# Patient Record
Sex: Male | Born: 1945 | Race: Black or African American | Hispanic: No | Marital: Married | Smoking: Never smoker
Health system: Southern US, Community
[De-identification: ages and names within clinical notes are randomized; demographics above are authoritative.]

## PROBLEM LIST (undated history)

## (undated) DIAGNOSIS — K219 Gastro-esophageal reflux disease without esophagitis: Secondary | ICD-10-CM

## (undated) DIAGNOSIS — E782 Mixed hyperlipidemia: Secondary | ICD-10-CM

## (undated) DIAGNOSIS — Z8709 Personal history of other diseases of the respiratory system: Secondary | ICD-10-CM

## (undated) DIAGNOSIS — E119 Type 2 diabetes mellitus without complications: Secondary | ICD-10-CM

## (undated) DIAGNOSIS — K5790 Diverticulosis of intestine, part unspecified, without perforation or abscess without bleeding: Secondary | ICD-10-CM

## (undated) DIAGNOSIS — I10 Essential (primary) hypertension: Secondary | ICD-10-CM

## (undated) DIAGNOSIS — N400 Enlarged prostate without lower urinary tract symptoms: Secondary | ICD-10-CM

## (undated) DIAGNOSIS — I2510 Atherosclerotic heart disease of native coronary artery without angina pectoris: Secondary | ICD-10-CM

## (undated) HISTORY — DX: Benign prostatic hyperplasia without lower urinary tract symptoms: N40.0

## (undated) HISTORY — DX: Atherosclerotic heart disease of native coronary artery without angina pectoris: I25.10

## (undated) HISTORY — DX: Gastro-esophageal reflux disease without esophagitis: K21.9

## (undated) HISTORY — DX: Mixed hyperlipidemia: E78.2

## (undated) HISTORY — DX: Type 2 diabetes mellitus without complications: E11.9

## (undated) HISTORY — PX: COLONOSCOPY: SHX174

## (undated) HISTORY — DX: Personal history of other diseases of the respiratory system: Z87.09

## (undated) HISTORY — DX: Essential (primary) hypertension: I10

## (undated) HISTORY — PX: HIP ARTHROSCOPY: SUR88

## (undated) HISTORY — PX: FRACTURE SURGERY: SHX138

## (undated) HISTORY — DX: Diverticulosis of intestine, part unspecified, without perforation or abscess without bleeding: K57.90

---
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
## 2019-07-26 ENCOUNTER — Emergency Department (HOSPITAL_COMMUNITY)
Admission: EM | Admit: 2019-07-26 | Discharge: 2019-07-26 | Disposition: A | Discharge status: Home / Self Care | Payer: Medicare HMO | Attending: Emergency Medicine | Admitting: Emergency Medicine

## 2019-07-26 ENCOUNTER — Emergency Department (HOSPITAL_COMMUNITY): Payer: Medicare HMO

## 2019-07-26 ENCOUNTER — Encounter (HOSPITAL_COMMUNITY): Payer: Self-pay | Admitting: Emergency Medicine

## 2019-07-26 ENCOUNTER — Other Ambulatory Visit: Payer: Self-pay

## 2019-07-26 DIAGNOSIS — I10 Essential (primary) hypertension: Secondary | ICD-10-CM | POA: Insufficient documentation

## 2019-07-26 DIAGNOSIS — R208 Other disturbances of skin sensation: Secondary | ICD-10-CM | POA: Diagnosis present

## 2019-07-26 DIAGNOSIS — E119 Type 2 diabetes mellitus without complications: Secondary | ICD-10-CM | POA: Diagnosis not present

## 2019-07-26 DIAGNOSIS — Z77098 Contact with and (suspected) exposure to other hazardous, chiefly nonmedicinal, chemicals: Secondary | ICD-10-CM | POA: Diagnosis not present

## 2019-07-26 MED ORDER — MAGIC MOUTHWASH W/LIDOCAINE: 5.0000 mL | Freq: Three times a day (TID) | ORAL | 0 refills | Status: DC | PRN

## 2019-07-26 NOTE — ED Triage Notes (Signed)
Approximately 2 weeks ago patient exposed to pesticide Triazicide, and currently having burning sensation in throat going into chest.  Seen by Primary and given Amoxicillan and Prednisone without any relief.

## 2019-07-26 NOTE — Discharge Instructions (Addendum)
Continue taking your prednisone as directed.  Follow-up with your primary doctor for recheck if needed.

## 2019-07-26 NOTE — ED Provider Notes (Signed)
[facility] EMERGENCY DEPARTMENT Provider Note   CSN: [id number] Arrival date & time: 07/26/19  1330     History Chief Complaint  Patient presents with  . Chemical Exposure    Pesticide    Zachary Craig is a 74 y.o. male.  HPI      Zachary Craig is a 74 y.o. male who presents to the Emergency Department complaining of burning sensation and tightness of his chest and throat.  Symptoms began nearly 1 week ago after accidentally inhaling a granular insecticide, Triazicide.  Patient was seen by PCP after exposure and prescribed amoxicillin which she is currently taking.  He denies any improvement of his symptoms, he was rechecked by PCP yesterday and started on prednisone.  He comes to the emergency department today for further evaluation of his burning pain.  He states that he feels he has difficulty swallowing and that his throat is closing up.  He is tolerating foods and liquids without difficulty.  He denies chest pain, vomiting, shortness of breath, fever, chills, swelling of his face lip or tongue.   Past Medical History:  Diagnosis Date  . Diabetes mellitus without complication (HCC)   . Hypertension     There are no problems to display for this patient.   Past Surgical History:  Procedure Laterality Date  . HIP ARTHROSCOPY Left        History reviewed. No pertinent family history.  Social History   Tobacco Use  . Smoking status: Never Smoker  . Smokeless tobacco: Never Used  Substance Use Topics  . Alcohol use: Never  . Drug use: Not on file    Home Medications Prior to Admission medications   Not on File    Allergies    Patient has no known allergies.  Review of Systems   Review of Systems  Constitutional: Negative for chills, fatigue and fever.  HENT: Positive for sore throat and trouble swallowing. Negative for congestion and voice change.   Respiratory: Positive for chest tightness. Negative for cough, shortness of breath and wheezing.     Cardiovascular: Negative for chest pain and palpitations.  Gastrointestinal: Negative for abdominal pain, blood in stool, nausea and vomiting.  Genitourinary: Negative for dysuria, flank pain and hematuria.  Musculoskeletal: Negative for arthralgias, back pain, myalgias, neck pain and neck stiffness.  Skin: Negative for rash.  Neurological: Negative for dizziness, weakness, numbness and headaches.  Hematological: Does not bruise/bleed easily.    Physical Exam Updated Vital Signs BP (!) 120/93 (BP Location: Right Arm)   Pulse 86   Temp 98.4 F (36.9 C) (Oral)   Resp 20   Ht 5\' 6"  (1.676 m)   Wt 88.9 kg   SpO2 100%   BMI 31.64 kg/m   Physical Exam Vitals and nursing note reviewed.  Constitutional:      Appearance: Normal appearance. He is not ill-appearing.  HENT:     Head: Atraumatic.     Mouth/Throat:     Mouth: Mucous membranes are moist.     Pharynx: Oropharynx is clear.     Comments: Mild edema of the uvula, uvula is midline.  No edema or erythema of the oropharynx.  No oral lesions or exudates noted. Eyes:     Conjunctiva/sclera: Conjunctivae normal.     Pupils: Pupils are equal, round, and reactive to light.  Cardiovascular:     Rate and Rhythm: Normal rate and regular rhythm.     Pulses: Normal pulses.  Musculoskeletal:  General: Normal range of motion.     Cervical back: Normal range of motion. No rigidity or tenderness.  Skin:    General: Skin is warm.     Capillary Refill: Capillary refill takes less than 2 seconds.     Findings: No erythema or rash.  Neurological:     General: No focal deficit present.     Mental Status: He is alert.     Sensory: No sensory deficit.     Motor: No weakness.     ED Results / Procedures / Treatments   Labs (all labs ordered are listed, but only abnormal results are displayed) Labs Reviewed - No data to display  EKG EKG Interpretation  Date/Time:  Tuesday July 26 2019 18:25:25 EDT Ventricular Rate:  65 PR  Interval:  172 QRS Duration: 80 QT Interval:  400 QTC Calculation: 416 R Axis:   0 Text Interpretation: Normal sinus rhythm Normal ECG No previous tracing Confirmed by Gwyneth Sprout ([id number]) on 07/27/2019 8:50:08 PM   Radiology DG Chest 2 View  Result Date: 07/26/2019 CLINICAL DATA:  Chemical inhalation 2 weeks ago EXAM: CHEST - 2 VIEW COMPARISON:  None. FINDINGS: The heart size and mediastinal contours are within normal limits. Both lungs are clear. The visualized skeletal structures are unremarkable. IMPRESSION: No active cardiopulmonary disease. Electronically Signed   By: Sharlet Salina M.D.   On: 07/26/2019 18:32    Procedures Procedures (including critical care time)  Medications Ordered in ED Medications - No data to display  ED Course  I have reviewed the triage vital signs and the nursing notes.  Pertinent labs & imaging results that were available during my care of the patient were reviewed by me and considered in my medical decision making (see chart for details).    MDM Rules/Calculators/A&P                          Patient here for evaluation of a inhaled chemical exposure that occurred 1 week ago.  Uvula is mildly edematous on exam but midline.  Airway is patent.  No oral lesions or other noted edema.  Patient speaking in complete sentences without respiratory distress.  Lungs are clear on exam EKG and chest x-ray are reassuring.  Patient currently taking prednisone which I feel is appropriate.  Symptoms likely related to irritation from inhaled chemical.  We will have him continue his steroid dose as directed and prescription given for Magic mouthwash for symptomatic treatment.  Patient agrees to close follow-up with PCP.  He appears appropriate for discharge home.   Final Clinical Impression(s) / ED Diagnoses Final diagnoses:  Exposure to chemical inhalation    Rx / DC Orders ED Discharge Orders    None       Pauline Aus, PA-C 07/28/19 1359      Geoffery Lyons, MD 07/30/19 2039

## 2019-08-05 ENCOUNTER — Encounter (HOSPITAL_BASED_OUTPATIENT_CLINIC_OR_DEPARTMENT_OTHER): Payer: Self-pay | Admitting: Emergency Medicine

## 2019-08-05 ENCOUNTER — Emergency Department (HOSPITAL_BASED_OUTPATIENT_CLINIC_OR_DEPARTMENT_OTHER): Payer: Medicare HMO

## 2019-08-05 ENCOUNTER — Other Ambulatory Visit: Payer: Self-pay

## 2019-08-05 ENCOUNTER — Emergency Department (HOSPITAL_BASED_OUTPATIENT_CLINIC_OR_DEPARTMENT_OTHER)
Admission: EM | Admit: 2019-08-05 | Discharge: 2019-08-05 | Disposition: A | Discharge status: Home / Self Care | Payer: Medicare HMO | Attending: Emergency Medicine | Admitting: Emergency Medicine

## 2019-08-05 DIAGNOSIS — I10 Essential (primary) hypertension: Secondary | ICD-10-CM | POA: Insufficient documentation

## 2019-08-05 DIAGNOSIS — T6091XA Toxic effect of unspecified pesticide, accidental (unintentional), initial encounter: Secondary | ICD-10-CM | POA: Diagnosis not present

## 2019-08-05 DIAGNOSIS — Z77098 Contact with and (suspected) exposure to other hazardous, chiefly nonmedicinal, chemicals: Secondary | ICD-10-CM

## 2019-08-05 DIAGNOSIS — E119 Type 2 diabetes mellitus without complications: Secondary | ICD-10-CM | POA: Diagnosis not present

## 2019-08-05 LAB — BASIC METABOLIC PANEL
Anion gap: 12 (ref 5–15)
BUN: 20 mg/dL (ref 8–23)
CO2: 24 mmol/L (ref 22–32)
Calcium: 8.7 mg/dL — ABNORMAL LOW (ref 8.9–10.3)
Chloride: 99 mmol/L (ref 98–111)
Creatinine, Ser: 1.26 mg/dL — ABNORMAL HIGH (ref 0.61–1.24)
GFR calc Af Amer: >60 mL/min (ref >60)
GFR calc non Af Amer: 57 mL/min — ABNORMAL LOW (ref >60)
Glucose, Bld: 137 mg/dL — ABNORMAL HIGH (ref 70–99)
Potassium: 5.1 mmol/L (ref 3.5–5.1)
Sodium: 135 mmol/L (ref 135–145)

## 2019-08-05 LAB — HEPATIC FUNCTION PANEL
ALT: 13 U/L (ref 0–44)
AST: 19 U/L (ref 15–41)
Albumin: 3.5 g/dL (ref 3.5–5.0)
Alkaline Phosphatase: 52 U/L (ref 38–126)
Bilirubin, Direct: 0.2 mg/dL (ref 0.0–0.2)
Indirect Bilirubin: 1.1 mg/dL — ABNORMAL HIGH (ref 0.3–0.9)
Total Bilirubin: 1.3 mg/dL — ABNORMAL HIGH (ref 0.3–1.2)
Total Protein: 7.2 g/dL (ref 6.5–8.1)

## 2019-08-05 LAB — CBC
HCT: 43.9 % (ref 39.0–52.0)
Hemoglobin: 14.5 g/dL (ref 13.0–17.0)
MCH: 31.5 pg (ref 26.0–34.0)
MCHC: 33 g/dL (ref 30.0–36.0)
MCV: 95.2 fL (ref 80.0–100.0)
Platelets: 311 10*3/uL (ref 150–400)
RBC: 4.61 MIL/uL (ref 4.22–5.81)
RDW: 12.7 % (ref 11.5–15.5)
WBC: 10.8 10*3/uL — ABNORMAL HIGH (ref 4.0–10.5)
nRBC: 0 % (ref 0.0–0.2)

## 2019-08-05 LAB — LIPASE, BLOOD: Lipase: 18 U/L (ref 11–51)

## 2019-08-05 LAB — TROPONIN I (HIGH SENSITIVITY): Troponin I (High Sensitivity): 3 ng/L (ref <18)

## 2019-08-05 MED ORDER — SUCRALFATE 1 GM/10ML PO SUSP: 1.0000 g | Freq: Once | ORAL | Status: AC

## 2019-08-05 MED ORDER — SUCRALFATE 1 GM/10ML PO SUSP
ORAL | Status: AC
  Administered 2019-08-05: 1 g via ORAL
  Filled 2019-08-05: qty 10

## 2019-08-05 MED ORDER — SODIUM CHLORIDE 0.9 % IV SOLN: INTRAVENOUS | Status: DC | PRN

## 2019-08-05 MED ORDER — FAMOTIDINE IN NACL 20-0.9 MG/50ML-% IV SOLN
20.0000 mg | Freq: Once | INTRAVENOUS | Status: AC
  Administered 2019-08-05: 20 mg via INTRAVENOUS
  Filled 2019-08-05: qty 50

## 2019-08-05 MED ORDER — PANTOPRAZOLE SODIUM 40 MG IV SOLR
40.0000 mg | Freq: Once | INTRAVENOUS | Status: AC
  Administered 2019-08-05: 40 mg via INTRAVENOUS
  Filled 2019-08-05: qty 40

## 2019-08-05 MED ORDER — SUCRALFATE 1 G PO TABS
1.0000 g | ORAL_TABLET | Freq: Once | ORAL | Status: DC
  Filled 2019-08-05: qty 1

## 2019-08-05 MED ORDER — FAMOTIDINE 20 MG PO TABS: 20.0000 mg | ORAL_TABLET | Freq: Two times a day (BID) | ORAL | 0 refills | Status: DC

## 2019-08-05 NOTE — Discharge Instructions (Addendum)
At this time there does not appear to be the presence of an emergent medical condition, however there is always the potential for conditions to change. Please read and follow the below instructions.  Please return to the Emergency Department immediately for any new or worsening symptoms. Please be sure to follow up with your Primary Care Provider within one week regarding your visit today; please call their office to schedule an appointment even if you are feeling better for a follow-up visit. Take the medication Pepcid as prescribed to help with your symptoms. Continue taking your Omeprazole as prescribed to help with your symptoms. You have been given referral to Dr. Jearld Fenton, Ear Nose and Throat Specialist for further evaluation of your symptoms. Call his office today to schedule a follow-up visit.  Get help right away if: You have pain in your arms, neck, jaw, teeth, or back. You feel sweaty, dizzy, or light-headed. You have chest pain or shortness of breath. You throw up (vomit) and your throw-up looks like blood or coffee grounds. You pass out (faint). Your poop (stool) is bloody or black. You cannot swallow, drink, or eat. You have any new/concerning or worsening of symptoms  Please read the additional information packets attached to your discharge summary.  Do not take your medicine if  develop an itchy rash, swelling in your mouth or lips, or difficulty breathing; call 911 and seek immediate emergency medical attention if this occurs.  You may review your lab tests and imaging results in their entirety on your MyChart account.  Please discuss all results of fully with your primary care provider and other specialist at your follow-up visit.  Note: Portions of this text may have been transcribed using voice recognition software. Every effort was made to ensure accuracy; however, inadvertent computerized transcription errors may still be present.

## 2019-08-05 NOTE — ED Triage Notes (Signed)
Pt exposed to pesticide Triazicide on 7/8 and has been having ongoing burning sensation to throat and chest since then. He has been seen several times with no improvement.

## 2019-08-05 NOTE — ED Provider Notes (Signed)
[facility] EMERGENCY DEPARTMENT Provider Note   CSN: [id number] Arrival date & time: 08/05/19  0944     History Chief Complaint  Patient presents with  . Chemical Exposure    Zachary Craig is a 74 y.o. male history of diabetes and hypertension.  Patient presents today for continued complications following Triazicide pesticide exposure on July 21, 2019.  He reports that he had breathed in some of this pesticide and has had a burning swelling sensation to his throat and chest since that time.  He reports that he has been intermittently on steroid medications and Benadryl with mild transient improvement of his symptoms, he complains symptoms are currently moderate intensity constant with no clear aggravating factors.  He reports he is currently taking a 5 mg daily prednisone prescribed by his primary care doctor.  He has also been on amoxicillin flu symptoms shortly after exposure without improvement.  Additionally patient reports he has developed multiple episodes of nonbloody/nonbilious emesis over the last 2 days and has had trouble keeping food down.  Denies any reexposure, denies fever/chills, vision changes, drooling, voice change, hemoptysis, pleurisy, abdominal pain, dysuria/hematuria, diarrhea, extremity swelling/color change or any additional concerns. HPI     Past Medical History:  Diagnosis Date  . Diabetes mellitus without complication (HCC)   . Hypertension     There are no problems to display for this patient.   Past Surgical History:  Procedure Laterality Date  . HIP ARTHROSCOPY Left        No family history on file.  Social History   Tobacco Use  . Smoking status: Never Smoker  . Smokeless tobacco: Never Used  Substance Use Topics  . Alcohol use: Never  . Drug use: Not on file    Home Medications Prior to Admission medications   Medication Sig Start Date End Date Taking? Authorizing Provider  famotidine (PEPCID) 20 MG tablet Take 1  tablet (20 mg total) by mouth 2 (two) times daily. 08/05/19   Harlene Salts A, PA-C  magic mouthwash w/lidocaine SOLN Take 5 mLs by mouth 3 (three) times daily as needed for mouth pain. Swish and spit, do not swallow 07/26/19   Triplett, Tammy, PA-C    Allergies    Patient has no known allergies.  Review of Systems   Review of Systems Ten systems are reviewed and are negative for acute change except as noted in the HPI  Physical Exam Updated Vital Signs BP 120/80   Pulse 71   Temp 99.1 F (37.3 C) (Oral)   Resp 20   Ht 5\' 6"  (1.676 m)   Wt 82 kg   SpO2 100%   BMI 29.17 kg/m   Physical Exam Constitutional:      General: He is not in acute distress.    Appearance: Normal appearance. He is well-developed. He is not ill-appearing or diaphoretic.  HENT:     Head: Normocephalic and atraumatic.     Jaw: There is normal jaw occlusion.     Mouth/Throat:     Comments: Erythema/cobblestoning of the posterior oropharynx, uvula and arches.  The patient has normal phonation and is in control of secretions. No stridor.  Midline uvula without edema. Soft palate rises symmetrically. No tonsillar swelling or exudates. Tongue protrusion is normal, floor of mouth is soft. No trismus. No creptius on neck palpation. No gingival erythema or fluctuance noted. Mucus membranes moist. Eyes:     General: Vision grossly intact. Gaze aligned appropriately.     Extraocular Movements: Extraocular  movements intact.     Pupils: Pupils are equal, round, and reactive to light.  Neck:     Trachea: Trachea and phonation normal. No tracheal tenderness or tracheal deviation.  Cardiovascular:     Rate and Rhythm: Normal rate and regular rhythm.  Pulmonary:     Effort: Pulmonary effort is normal. No accessory muscle usage or respiratory distress.     Breath sounds: Normal breath sounds and air entry.  Chest:     Chest wall: No tenderness.  Abdominal:     General: There is no distension.     Palpations:  Abdomen is soft.     Tenderness: There is no abdominal tenderness. There is no guarding or rebound.  Musculoskeletal:        General: Normal range of motion.     Cervical back: Normal range of motion and neck supple. No edema or rigidity.     Right lower leg: No edema.     Left lower leg: No edema.  Skin:    General: Skin is warm and dry.  Neurological:     Mental Status: He is alert.     GCS: GCS eye subscore is 4. GCS verbal subscore is 5. GCS motor subscore is 6.     Comments: Speech is clear and goal oriented, follows commands Major Cranial nerves without deficit, no facial droop Moves extremities without ataxia, coordination intact  Psychiatric:        Behavior: Behavior normal.     ED Results / Procedures / Treatments   Labs (all labs ordered are listed, but only abnormal results are displayed) Labs Reviewed  BASIC METABOLIC PANEL - Abnormal; Notable for the following components:      Result Value   Glucose, Bld 137 (*)    Creatinine, Ser 1.26 (*)    Calcium 8.7 (*)    GFR calc non Af Amer 57 (*)    All other components within normal limits  CBC - Abnormal; Notable for the following components:   WBC 10.8 (*)    All other components within normal limits  HEPATIC FUNCTION PANEL - Abnormal; Notable for the following components:   Total Bilirubin 1.3 (*)    Indirect Bilirubin 1.1 (*)    All other components within normal limits  LIPASE, BLOOD  TROPONIN I (HIGH SENSITIVITY)    EKG EKG Interpretation  Date/Time:  Friday August 05 2019 10:56:11 EDT Ventricular Rate:  64 PR Interval:    QRS Duration: 99 QT Interval:  420 QTC Calculation: 434 R Axis:   -9 Text Interpretation: Sinus rhythm Abnormal R-wave progression, early transition no change from previous Confirmed by Arby Barrette [phone]) on 08/05/2019 1:09:02 PM   Radiology DG Chest 2 View  Result Date: 08/05/2019 CLINICAL DATA:  Burning sensation for 1.5 months since pesticide exposure. EXAM: CHEST - 2 VIEW  COMPARISON:  07/26/2019 FINDINGS: Telemetry leads overlie the chest. The cardiomediastinal silhouette is unchanged with normal heart size. The lungs are well inflated and clear. No pleural effusion or pneumothorax is identified. No acute osseous abnormality is seen. IMPRESSION: No active cardiopulmonary disease. Electronically Signed   By: Sebastian Ache M.D.   On: 08/05/2019 11:24    Procedures Procedures (including critical care time)  Medications Ordered in ED Medications  0.9 %  sodium chloride infusion ( Intravenous Stopped 08/05/19 1239)  famotidine (PEPCID) IVPB 20 mg premix (0 mg Intravenous Stopped 08/05/19 1239)  pantoprazole (PROTONIX) injection 40 mg (40 mg Intravenous Given 08/05/19 1349)  sucralfate (CARAFATE) 1  GM/10ML suspension 1 g (1 g Oral Given 08/05/19 1348)    ED Course  I have reviewed the triage vital signs and the nursing notes.  Pertinent labs & imaging results that were available during my care of the patient were reviewed by me and considered in my medical decision making (see chart for details).    MDM Rules/Calculators/A&P                         Additional history obtained from: 1. Nursing notes from this visit. 2. EMR, reviewed ED visit from July 26, 2019 where patient presented with the same complaint.  At that time patient had reassuring chest x-ray and EKG, was discharged with Magic mouthwash and encouraged to continue prednisone. 3. Family, patient's wife at bedside. 4. Reviewed Triazicide safety data sheet, no specific recommendations for inhalation/ingestions. Shows acute inhalation LC50>2mg /L (EPA tox. Category IV) No label elements required --------------------------------- I ordered, reviewed and interpreted labs which include: High-sensitivity troponin within normal limits, symptom onset greater than 2 weeks ago no indication for delta troponin. Lipase within normal limits, doubt pancreatitis. BMP shows mild elevation of creatinine at 1.26, normal  BUN, no priors to compare, no emergent electrolyte derangement or gap. LFTs show no emergent elevations. CBC shows nonspecific instances of 10.8, normal hemoglobin.  CXR:  IMPRESSION:  No active cardiopulmonary disease.  I personally reviewed patient's chest x-ray and agree with radiologist interpretation.  EKG: Sinus rhythm Abnormal R-wave progression, early transition no change from previous Confirmed by Arby Barrette [phone]) on 08/05/2019 1:09:02 PM - Given information obtained on the chemical safety data sheet lower suspicion that this is the cause of his symptoms today.  Symptoms are suspicious for esophagitis.  Patient treated with Protonix, Pepcid and Carafate.  Low suspicion for ACS, PE, dissection or other emergent cardiopulmonary etiologies at this time.  Tolerating p.o. and abdomen is soft and nontender doubt perforation, SBO or other emergent pathologies at this time.  Patient seen and evaluated by Dr. Donnald Garre, plan of care at this time is to start patient on Pepcid, have him continue his omeprazole at home, give GERD handout regarding food choices and discharge.  Additionally will give patient referral to ENT for follow-up for burning sensation.  On reassessment patient is resting comfortably no acute distress states understanding of care plan and is agreeable to discharge.  At this time there does not appear to be any evidence of an acute emergency medical condition and the patient appears stable for discharge with appropriate outpatient follow up. Diagnosis was discussed with patient who verbalizes understanding of care plan and is agreeable to discharge. I have discussed return precautions with patient and wife who verbalizes understanding. Patient encouraged to follow-up with their PCP and ENT. All questions answered.   Note: Portions of this report may have been transcribed using voice recognition software. Every effort was made to ensure accuracy; however, inadvertent  computerized transcription errors may still be present. Final Clinical Impression(s) / ED Diagnoses Final diagnoses:  Chemical exposure    Rx / DC Orders ED Discharge Orders         Ordered    famotidine (PEPCID) 20 MG tablet  2 times daily     Discontinue  Reprint     08/05/19 1508           Elizabeth Palau 08/05/19 1518    Arby Barrette, MD 08/10/19 1354

## 2019-08-05 NOTE — ED Provider Notes (Signed)
Medical screening examination/treatment/procedure(s) were conducted as a shared visit with non-physician practitioner(s) and myself.  I personally evaluated the patient during the encounter.  EKG Interpretation  Date/Time:  Friday August 05 2019 10:56:11 EDT Ventricular Rate:  64 PR Interval:    QRS Duration: 99 QT Interval:  420 QTC Calculation: 434 R Axis:   -9 Text Interpretation: Sinus rhythm Abnormal R-wave progression, early transition no change from previous Confirmed by Arby Barrette [phone]) on 08/05/2019 1:09:02 PM  Patient has had persistent burning sensation in his throat since inhaling up pesticide several weeks earlier.  He has seen his PCP and had multiple therapies.  He has been tried on a steroid medication and Benadryl with mild improvement at the time but then recrudescence of quality of burning in his throat.  No difficulty breathing or swallowing.  Just over the past several days patient has had several episodes of vomiting.  Nonbloody.  The patient is clinically well in appearance.  He is alert and nontoxic.  Airway widely patent.  No erythema or intraoral oral swelling.  Neck is supple.  No stridor.  No difficulty handling secretions.  Heart regular, lungs clear.  Abdomen is soft without tenderness or guarding.   Diagnostic studies unremarkable at this time.  I have low suspicion that this is a residual reaction or injury from the inhalation of pesticides several weeks earlier.  Physical exam is normal.  No upper airway compromise or wheezing on exam.  Patient has a persistent intermittent burning quality in his throat.  At this time recommend initiating PPI therapy and GERD precautions.  Patient recommended for close follow-up with PCP for ongoing monitoring of his symptoms and response to treatment.   Arby Barrette, MD 08/10/19 1353

## 2019-08-08 ENCOUNTER — Encounter (INDEPENDENT_AMBULATORY_CARE_PROVIDER_SITE_OTHER): Payer: Self-pay | Admitting: Gastroenterology

## 2019-08-08 ENCOUNTER — Other Ambulatory Visit: Payer: Self-pay

## 2019-08-08 ENCOUNTER — Ambulatory Visit (INDEPENDENT_AMBULATORY_CARE_PROVIDER_SITE_OTHER): Payer: Medicare HMO | Admitting: Gastroenterology

## 2019-08-08 ENCOUNTER — Encounter (INDEPENDENT_AMBULATORY_CARE_PROVIDER_SITE_OTHER): Payer: Self-pay | Admitting: *Deleted

## 2019-08-08 ENCOUNTER — Other Ambulatory Visit (INDEPENDENT_AMBULATORY_CARE_PROVIDER_SITE_OTHER): Payer: Self-pay | Admitting: *Deleted

## 2019-08-08 VITALS — BP 115/76 | HR 76 | Temp 98.6°F | Wt 179.0 lb

## 2019-08-08 DIAGNOSIS — K219 Gastro-esophageal reflux disease without esophagitis: Secondary | ICD-10-CM

## 2019-08-08 DIAGNOSIS — R1310 Dysphagia, unspecified: Secondary | ICD-10-CM | POA: Diagnosis not present

## 2019-08-08 DIAGNOSIS — R112 Nausea with vomiting, unspecified: Secondary | ICD-10-CM

## 2019-08-08 DIAGNOSIS — R1319 Other dysphagia: Secondary | ICD-10-CM

## 2019-08-08 MED ORDER — ONDANSETRON HCL 4 MG/5ML PO SOLN: 4.0000 mg | Freq: Three times a day (TID) | ORAL | 2 refills | Status: DC | PRN

## 2019-08-08 NOTE — Patient Instructions (Addendum)
Schedule EGD Schedule barium esophagram with pill Start zofran 4 mg q8h as needed for vomiting

## 2019-08-08 NOTE — Progress Notes (Signed)
Zachary Craig, M.D. Gastroenterology & Hepatology [name] [address] [place], [place] [address] Primary Care Physician: Zachary Specking, MD [address] [place] [address]  Referring MD: self  I will communicate my assessment and recommendations to the referring MD via EMR. Note: Occasional unusual wording and randomly placed punctuation marks may result from the use of speech recognition technology to transcribe this document"  Chief Complaint: Dysphagia  History of Present Illness: Zachary Craig is a 74 y.o. male with PMH DM, GERD and HTN, who presents for evaluation of dysphagia after recent exposure to Triazicide.  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  After this 12 hours later he presented persistent discomfort and soreness in his throat after which he presented episodes of hiccups.  A day later he reports having a sensation of tightness in his throat that was not allowing him to swallow adequately.  Since then he has presented progressive dysphagia to solids that has affected his food intake severely.  The patient states that he is only able to swallow applesauce consistency food at this moment, he is not able to swallow pills adequately.  The patient was seen by his PCP who prescribed him prednisone 10 mg every day, he has been seen twice in the ER for similar symptoms.  He has been tapered down on his prednisone dose by 5 mg but he took the medication until last Saturday without any improvement in his symptomatology.  The patient states that the only medication that helped him was Magic mouthwash that decreased his throat discomfort mildly.  He denies having any odynophagia.  He has lost close to 18 pounds since his symptoms started.  He has also reported having decreased appetite and anosmia.  He is getting more  concerned as he has been throwing up 2 times per day recently.  He is having several bouts of dry heaving since last Tuesday.  His last full meal was 2 weeks ago.  Last time he had some solid was last Friday (noodle soup) but he threw it up.  The patient endorses having a history of GERD for many years which she has been managing with omeprazole as needed.  He was put on omeprazole daily recently as he was also taking steroids..  He was also given a prescription for Pepcid but he vomited it. The patient denies having any nausea, vomiting, fever, chills, hematochezia, melena, hematemesis, abdominal distention, abdominal pain, diarrhea, jaundice, pruritus or weight loss.  Last EGD: never Last Colonoscopy: less than 10 years ago per patient but no polyps.  FHx: neg for any gastrointestinal/liver disease, cousin colon cancer dx'd in her 50s Social: neg smoking, alcohol or illicit drug use Surgical: non contributory  Past Medical History: Past Medical History:  Diagnosis Date  . Diabetes mellitus without complication (HCC)   . Hypertension     Past Surgical History: Past Surgical History:  Procedure Laterality Date  . HIP ARTHROSCOPY Left     Family History:History reviewed. No pertinent family history.  Social History: Social History   Tobacco Use  Smoking Status Never Smoker  Smokeless Tobacco Never Used   Social History   Substance and Sexual Activity  Alcohol Use Never   Social History   Substance and Sexual Activity  Drug Use Not on file    Allergies: Allergies  Allergen Reactions  . Sulfa  Antibiotics     Medications: Current Outpatient Medications  Medication Sig Dispense Refill  . famotidine (PEPCID) 20 MG tablet Take 1 tablet (20 mg total) by mouth 2 (two) times daily. 30 tablet 0  . losartan (COZAAR) 25 MG tablet Take 25 mg by mouth daily.    . metFORMIN (GLUCOPHAGE-XR) 500 MG 24 hr tablet Take 1,000 mg by mouth daily.    . Multiple Vitamins-Minerals  (MULTIMINERAL PLUS PO) Take by mouth. Once a day    . multivitamin-iron-minerals-folic acid (CENTRUM) chewable tablet Chew 1 tablet by mouth daily. Once a day    . omeprazole (PRILOSEC) 40 MG capsule SMARTSIG:1 Capsule(s) By Mouth PRN    . Probiotic Product (DIGESTIVE ADV+BOWEL SUPPORT PO) Take by mouth. 2 tab once a day    . rosuvastatin (CRESTOR) 5 MG tablet Take 5 mg by mouth once a week.    . tamsulosin (FLOMAX) 0.4 MG CAPS capsule Take 0.4 mg by mouth daily.    . magic mouthwash w/lidocaine SOLN Take 5 mLs by mouth 3 (three) times daily as needed for mouth pain. Swish and spit, do not swallow (Patient not taking: Reported on 08/08/2019) 100 mL 0  . ondansetron (ZOFRAN) 4 MG/5ML solution Take 5 mLs (4 mg total) by mouth every 8 (eight) hours as needed for nausea or vomiting. 50 mL 2   No current facility-administered medications for this visit.    Review of Systems: GENERAL: negative for malaise, night sweats HEENT: No changes in hearing or vision, no nose bleeds or other nasal problems. NECK: Negative for lumps, goiter, pain and significant neck swelling RESPIRATORY: Negative for cough, wheezing CARDIOVASCULAR: Negative for chest pain, leg swelling, palpitations, orthopnea GI: SEE HPI MUSCULOSKELETAL: Negative for joint pain or swelling, back pain, and muscle pain. SKIN: Negative for lesions, rash PSYCH: Negative for sleep disturbance, mood disorder and recent psychosocial stressors. HEMATOLOGY Negative for prolonged bleeding, bruising easily, and swollen nodes. ENDOCRINE: Negative for cold or heat intolerance, polyuria, polydipsia and goiter. NEURO: negative for tremor, gait imbalance, syncope and seizures. The remainder of the review of systems is noncontributory.   Physical Exam: BP 115/76   Pulse 76   Temp 98.6 F (37 C)   Wt 179 lb (81.2 kg)   BMI 28.89 kg/m  GENERAL: The patient is AO x3, in no acute distress. HEENT: Head is normocephalic and atraumatic. EOMI are  intact. Mouth is well hydrated and without lesions. NECK: Supple. No masses or lymphadenopathy LUNGS: Clear to auscultation. No presence of rhonchi/wheezing/rales. Adequate chest expansion HEART: RRR, normal s1 and s2. ABDOMEN: Soft, nontender, no guarding, no peritoneal signs, and nondistended. BS +. No masses. EXTREMITIES: Without any cyanosis, clubbing, rash, lesions or edema. NEUROLOGIC: AOx3, no focal motor deficit. SKIN: no jaundice, no rashes   Imaging/Labs: as above  I personally reviewed and interpreted the available labs, imaging and endoscopic files.  Impression and Plan: May Ozment is a 74 y.o. male with PMH DM, GERD and HTN, who presents for evaluation of dysphagia after recent exposure to Triazicide.  The patient has presented with recent onset of dysphagia mostly to solids with significant weight loss and changes in his appetite and smell.  I checked the components of Triazicide, its main active component is Gamma-Cyhalothrin.  The substance is not associated directly to any caustic effects or to esophagitis, but it is reportedly early can cause peripheral neuropathy.  It seems less likely that this is the insulting agent leading to the patient's alterations as he clearly states that  he inhaled the substance and did not swallow it.  Given the fact he has lost a large amount of weight and he has been poorly tolerating food intake, we will need to investigate his dysphagia with an EGD and a barium esophagram.  The patient will be given a prescription for liquid Zofran so he can decrease his nausea and vomiting episodes.  The patient was advised that if he presents persistent symptoms despite taking the medication or if he is unable to eat/drink anything, he will need to go to the ER for admission with IV fluid hydration and evaluation by our service inpatient.  The patient understood and agreed.  More than 50% of the office visit was dedicated to discussing the procedure, including  the day of and risks involved. Patient understands what the procedure involves including the benefits and any risks. Patient understands alternatives to the proposed procedure. Risks including (but not limited to) bleeding, tearing of the lining (perforation), rupture of adjacent organs, problems with heart and lung function, infection, and medication reactions. A small percentage of complications may require surgery, hospitalization, repeat endoscopic procedure, and/or transfusion.  - Schedule EGD - Schedule barium esophagram with pill - Start liquid zofran 4 mg q8h as needed for vomiting  All questions were answered.      Dolores Frame, MD Gastroenterology and Hepatology [facility]

## 2019-08-09 ENCOUNTER — Other Ambulatory Visit: Payer: Self-pay

## 2019-08-09 ENCOUNTER — Other Ambulatory Visit (HOSPITAL_COMMUNITY)
Admission: RE | Admit: 2019-08-09 | Discharge: 2019-08-09 | Disposition: A | Discharge status: Home / Self Care | Payer: Medicare HMO | Source: Ambulatory Visit | Attending: Gastroenterology | Admitting: Gastroenterology

## 2019-08-09 ENCOUNTER — Encounter (HOSPITAL_COMMUNITY): Payer: Self-pay

## 2019-08-09 ENCOUNTER — Encounter (HOSPITAL_COMMUNITY)
Admission: RE | Admit: 2019-08-09 | Discharge: 2019-08-09 | Disposition: A | Discharge status: Home / Self Care | Payer: Medicare HMO | Source: Ambulatory Visit | Attending: Gastroenterology | Admitting: Gastroenterology

## 2019-08-09 DIAGNOSIS — Z01812 Encounter for preprocedural laboratory examination: Secondary | ICD-10-CM | POA: Insufficient documentation

## 2019-08-09 DIAGNOSIS — Z20822 Contact with and (suspected) exposure to covid-19: Secondary | ICD-10-CM | POA: Insufficient documentation

## 2019-08-09 LAB — SARS CORONAVIRUS 2 (TAT 6-24 HRS): SARS Coronavirus 2: NEGATIVE

## 2019-08-09 NOTE — Patient Instructions (Signed)
Zachary Craig  08/09/2019     @PREFPERIOPPHARMACY @   Your procedure is scheduled on 08/12/2019.  Report to 08/14/2019 at  1315 (1:15)  P.M.  Call this number if you have problems the morning of surgery:  [phone]   Remember:  Follow the diet and prep instructions given to you by Dr [phone] office.                       Take these medicines the morning of surgery with A SIP OF WATER  Pepcid, omeprazole, zofran(if needed), flomax.    Do not wear jewelry, make-up or nail polish.  Do not wear lotions, powders, or perfume. Please wear deodorant and brush your teeth.  Do not shave 48 hours prior to surgery.  Men may shave face and neck.  Do not bring valuables to the hospital.  [facility] is not responsible for any belongings or valuables.  Contacts, dentures or bridgework may not be worn into surgery.  Leave your suitcase in the car.  After surgery it may be brought to your room.  For patients admitted to the hospital, discharge time will be determined by your treatment team.  Patients discharged the day of surgery will not be allowed to drive home.   Name and phone number of your driver:   family Special instructions: DO NOT smoke the morning of your procedure.  Please read over the following fact sheets that you were given. Anesthesia Post-op Instructions and Care and Recovery After Surgery       Upper Endoscopy, Adult, Care After This sheet gives you information about how to care for yourself after your procedure. Your health care provider may also give you more specific instructions. If you have problems or questions, contact your health care provider. What can I expect after the procedure? After the procedure, it is common to have:  A sore throat.  Mild stomach pain or discomfort.  Bloating.  Nausea. Follow these instructions at home:   Follow instructions from your health care provider about what to eat or drink after your procedure.   Return to your normal activities as told by your health care provider. Ask your health care provider what activities are safe for you.  Take over-the-counter and prescription medicines only as told by your health care provider.  Do not drive for 24 hours if you were given a sedative during your procedure.  Keep all follow-up visits as told by your health care provider. This is important. Contact a health care provider if you have:  A sore throat that lasts longer than one day.  Trouble swallowing. Get help right away if:  You vomit blood or your vomit looks like coffee grounds.  You have: ? A fever. ? Bloody, black, or tarry stools. ? A severe sore throat or you cannot swallow. ? Difficulty breathing. ? Severe pain in your chest or abdomen. Summary  After the procedure, it is common to have a sore throat, mild stomach discomfort, bloating, and nausea.  Do not drive for 24 hours if you were given a sedative during the procedure.  Follow instructions from your health care provider about what to eat or drink after your procedure.  Return to your normal activities as told by your health care provider. This information is not intended to replace advice given to you by your health care provider. Make sure you discuss any questions you have with your health care  provider. Document Revised: 06/23/2017 Document Reviewed: 06/01/2017 Elsevier Patient Education  2020 Elsevier Inc.  Esophageal Dilatation Esophageal dilatation, also called esophageal dilation, is a procedure to widen or open (dilate) a blocked or narrowed part of the esophagus. The esophagus is the part of the body that moves food and liquid from the mouth to the stomach. You may need this procedure if:  You have a buildup of scar tissue in your esophagus that makes it difficult, painful, or impossible to swallow. This can be caused by gastroesophageal reflux disease (GERD).  You have cancer of the esophagus.  There is a  problem with how food moves through your esophagus. In some cases, you may need this procedure repeated at a later time to dilate the esophagus gradually. Tell a health care provider about:  Any allergies you have.  All medicines you are taking, including vitamins, herbs, eye drops, creams, and over-the-counter medicines.  Any problems you or family members have had with anesthetic medicines.  Any blood disorders you have.  Any surgeries you have had.  Any medical conditions you have.  Any antibiotic medicines you are required to take before dental procedures.  Whether you are pregnant or may be pregnant. What are the risks? Generally, this is a safe procedure. However, problems may occur, including:  Bleeding due to a tear in the lining of the esophagus.  A hole (perforation) in the esophagus. What happens before the procedure?  Follow instructions from your health care provider about eating or drinking restrictions.  Ask your health care provider about changing or stopping your regular medicines. This is especially important if you are taking diabetes medicines or blood thinners.  Plan to have someone take you home from the hospital or clinic.  Plan to have a responsible adult care for you for at least 24 hours after you leave the hospital or clinic. This is important. What happens during the procedure?  You may be given a medicine to help you relax (sedative).  A numbing medicine may be sprayed into the back of your throat, or you may gargle the medicine.  Your health care provider may perform the dilatation using various surgical instruments, such as: ? Simple dilators. This instrument is carefully placed in the esophagus to stretch it. ? Guided wire bougies. This involves using an endoscope to insert a wire into the esophagus. A dilator is passed over this wire to enlarge the esophagus. Then the wire is removed. ? Balloon dilators. An endoscope with a small balloon at  the end is inserted into the esophagus. The balloon is inflated to stretch the esophagus and open it up. The procedure may vary among health care providers and hospitals. What happens after the procedure?  Your blood pressure, heart rate, breathing rate, and blood oxygen level will be monitored until the medicines you were given have worn off.  Your throat may feel slightly sore and numb. This will improve slowly over time.  You will not be allowed to eat or drink until your throat is no longer numb.  When you are able to drink, urinate, and sit on the edge of the bed without nausea or dizziness, you may be able to return home. Follow these instructions at home:  Take over-the-counter and prescription medicines only as told by your health care provider.  Do not drive for 24 hours if you were given a sedative during your procedure.  You should have a responsible adult with you for 24 hours after the procedure.  Follow instructions from your health care provider about any eating or drinking restrictions.  Do not use any products that contain nicotine or tobacco, such as cigarettes and e-cigarettes. If you need help quitting, ask your health care provider.  Keep all follow-up visits as told by your health care provider. This is important. Get help right away if you:  Have a fever.  Have chest pain.  Have pain that is not relieved by medication.  Have trouble breathing.  Have trouble swallowing.  Vomit blood. Summary  Esophageal dilatation, also called esophageal dilation, is a procedure to widen or open (dilate) a blocked or narrowed part of the esophagus.  Plan to have someone take you home from the hospital or clinic.  For this procedure, a numbing medicine may be sprayed into the back of your throat, or you may gargle the medicine.  Do not drive for 24 hours if you were given a sedative during your procedure. This information is not intended to replace advice given to  you by your health care provider. Make sure you discuss any questions you have with your health care provider. Document Revised: 10/27/2018 Document Reviewed: 11/04/2016 Elsevier Patient Education  2020 [place] After These instructions provide you with information about caring for yourself after your procedure. Your health care provider may also give you more specific instructions. Your treatment has been planned according to current medical practices, but problems sometimes occur. Call your health care provider if you have any problems or questions after your procedure. What can I expect after the procedure? After your procedure, you may:  Feel sleepy for several hours.  Feel clumsy and have poor balance for several hours.  Feel forgetful about what happened after the procedure.  Have poor judgment for several hours.  Feel nauseous or vomit.  Have a sore throat if you had a breathing tube during the procedure. Follow these instructions at home: For at least 24 hours after the procedure:      Have a responsible adult stay with you. It is important to have someone help care for you until you are awake and alert.  Rest as needed.  Do not: ? Participate in activities in which you could fall or become injured. ? Drive. ? Use heavy machinery. ? Drink alcohol. ? Take sleeping pills or medicines that cause drowsiness. ? Make important decisions or sign legal documents. ? Take care of children on your own. Eating and drinking  Follow the diet that is recommended by your health care provider.  If you vomit, drink water, juice, or soup when you can drink without vomiting.  Make sure you have little or no nausea before eating solid foods. General instructions  Take over-the-counter and prescription medicines only as told by your health care provider.  If you have sleep apnea, surgery and certain medicines can increase your risk for breathing  problems. Follow instructions from your health care provider about wearing your sleep device: ? Anytime you are sleeping, including during daytime naps. ? While taking prescription pain medicines, sleeping medicines, or medicines that make you drowsy.  If you smoke, do not smoke without supervision.  Keep all follow-up visits as told by your health care provider. This is important. Contact a health care provider if:  You keep feeling nauseous or you keep vomiting.  You feel light-headed.  You develop a rash.  You have a fever. Get help right away if:  You have trouble breathing. Summary  For several  hours after your procedure, you may feel sleepy and have poor judgment.  Have a responsible adult stay with you for at least 24 hours or until you are awake and alert. This information is not intended to replace advice given to you by your health care provider. Make sure you discuss any questions you have with your health care provider. Document Revised: 03/30/2017 Document Reviewed: 04/22/2015 Elsevier Patient Education  [place].

## 2019-08-12 ENCOUNTER — Ambulatory Visit (HOSPITAL_COMMUNITY)
Admission: RE | Admit: 2019-08-12 | Discharge: 2019-08-12 | Disposition: A | Discharge status: Home / Self Care | Payer: Medicare HMO | Attending: Gastroenterology | Admitting: Gastroenterology

## 2019-08-12 ENCOUNTER — Ambulatory Visit (HOSPITAL_COMMUNITY): Payer: Medicare HMO | Admitting: Anesthesiology

## 2019-08-12 ENCOUNTER — Encounter (HOSPITAL_COMMUNITY)
Admission: RE | Disposition: A | Discharge status: Home / Self Care | Payer: Self-pay | Source: Home / Self Care | Attending: Gastroenterology

## 2019-08-12 ENCOUNTER — Encounter (HOSPITAL_COMMUNITY): Payer: Self-pay | Admitting: Gastroenterology

## 2019-08-12 DIAGNOSIS — K219 Gastro-esophageal reflux disease without esophagitis: Secondary | ICD-10-CM

## 2019-08-12 DIAGNOSIS — K317 Polyp of stomach and duodenum: Secondary | ICD-10-CM | POA: Diagnosis not present

## 2019-08-12 DIAGNOSIS — R112 Nausea with vomiting, unspecified: Secondary | ICD-10-CM | POA: Diagnosis not present

## 2019-08-12 DIAGNOSIS — E119 Type 2 diabetes mellitus without complications: Secondary | ICD-10-CM | POA: Diagnosis not present

## 2019-08-12 DIAGNOSIS — Z7984 Long term (current) use of oral hypoglycemic drugs: Secondary | ICD-10-CM | POA: Diagnosis not present

## 2019-08-12 DIAGNOSIS — I10 Essential (primary) hypertension: Secondary | ICD-10-CM | POA: Insufficient documentation

## 2019-08-12 DIAGNOSIS — Z79899 Other long term (current) drug therapy: Secondary | ICD-10-CM | POA: Insufficient documentation

## 2019-08-12 DIAGNOSIS — K2960 Other gastritis without bleeding: Secondary | ICD-10-CM | POA: Diagnosis not present

## 2019-08-12 DIAGNOSIS — K3189 Other diseases of stomach and duodenum: Secondary | ICD-10-CM

## 2019-08-12 DIAGNOSIS — R1310 Dysphagia, unspecified: Secondary | ICD-10-CM | POA: Insufficient documentation

## 2019-08-12 DIAGNOSIS — R1314 Dysphagia, pharyngoesophageal phase: Secondary | ICD-10-CM | POA: Diagnosis not present

## 2019-08-12 DIAGNOSIS — K2950 Unspecified chronic gastritis without bleeding: Secondary | ICD-10-CM | POA: Insufficient documentation

## 2019-08-12 HISTORY — PX: BIOPSY: SHX5522

## 2019-08-12 HISTORY — PX: ESOPHAGEAL DILATION: SHX303

## 2019-08-12 HISTORY — PX: ESOPHAGOGASTRODUODENOSCOPY (EGD) WITH PROPOFOL: SHX5813

## 2019-08-12 LAB — GLUCOSE, CAPILLARY
Glucose-Capillary: 114 mg/dL — ABNORMAL HIGH (ref 70–99)
Glucose-Capillary: 115 mg/dL — ABNORMAL HIGH (ref 70–99)

## 2019-08-12 SURGERY — ESOPHAGOGASTRODUODENOSCOPY (EGD) WITH PROPOFOL
Anesthesia: General

## 2019-08-12 MED ORDER — LIDOCAINE VISCOUS HCL 2 % MT SOLN
OROMUCOSAL | Status: AC
  Filled 2019-08-12: qty 15

## 2019-08-12 MED ORDER — GLYCOPYRROLATE 0.2 MG/ML IJ SOLN
0.2000 mg | Freq: Once | INTRAMUSCULAR | Status: AC
  Administered 2019-08-12: 0.2 mg via INTRAVENOUS
  Filled 2019-08-12: qty 1

## 2019-08-12 MED ORDER — LIDOCAINE VISCOUS HCL 2 % MT SOLN
15.0000 mL | Freq: Once | OROMUCOSAL | Status: AC
  Administered 2019-08-12: 15 mL via OROMUCOSAL

## 2019-08-12 MED ORDER — PROPOFOL 500 MG/50ML IV EMUL
INTRAVENOUS | Status: DC | PRN
  Administered 2019-08-12: 150 ug/kg/min via INTRAVENOUS

## 2019-08-12 MED ORDER — LACTATED RINGERS IV SOLN: Freq: Once | INTRAVENOUS | Status: AC

## 2019-08-12 MED ORDER — PROPOFOL 10 MG/ML IV BOLUS
INTRAVENOUS | Status: DC | PRN
  Administered 2019-08-12: 50 mg via INTRAVENOUS

## 2019-08-12 MED ORDER — LACTATED RINGERS IV SOLN
INTRAVENOUS | Status: DC | PRN
Start: 2019-08-12 — End: 2019-08-12

## 2019-08-12 NOTE — Op Note (Addendum)
[facility] Patient Name: Zachary Craig Procedure Date: 08/12/2019 11:44 AM MRN: [id number] Date of Birth: [birth year] Attending MD: [name] ,  CSN: [id number] Age: 74 Admit Type: Outpatient Procedure:                Upper GI endoscopy Indications:              Dysphagia, vomiting Providers:                [name], Criselda Peaches. Patsy Lager, RN, Edythe Clarity, Technician Referring MD:              Medicines:                Monitored Anesthesia Care Complications:            No immediate complications. Estimated Blood Loss:     Estimated blood loss: none. Procedure:                Pre-Anesthesia Assessment:                           - Prior to the procedure, a History and Physical                            was performed, and patient medications, allergies                            and sensitivities were reviewed. The patient's                            tolerance of previous anesthesia was reviewed.                           - The risks and benefits of the procedure and the                            sedation options and risks were discussed with the                            patient. All questions were answered and informed                            consent was obtained.                           - ASA Grade Assessment: II - A patient with mild                            systemic disease.                           After obtaining informed consent, the endoscope was                            passed under direct vision. Throughout the  procedure, the patient's blood pressure, pulse, and                            oxygen saturations were monitored continuously. The                            [id number]-H190 ([id number]) scope was introduced through the                            mouth, and advanced to the second part of duodenum.                            The upper GI endoscopy was accomplished without                             difficulty. The patient tolerated the procedure                            well. Scope In: 11:49:29 AM Scope Out: 12:02:54 PM Total Procedure Duration: 0 hours 13 minutes 25 seconds  Findings:      The Z-line was regular and was found 37 cm from the incisors.      The examined esophagus was normal. A guidewire was placed and the scope       was withdrawn. Dilation was performed with a Savary dilator with no       resistance at 18 mm. No hematin was seen upon reinspection.      A single localized 3 mm erosion with no bleeding and no stigmata of       recent bleeding was found in the gastric body. Biopsies from body and       antrum were taken with a cold forceps for Helicobacter pylori testing.      Three small sessile polyps were found in the gastric fundus, consistent       with gastric fundic polyps.      The examined duodenum was normal. Impression:               - Z-line regular, 37 cm from the incisors.                           - Normal esophagus. Dilated.                           - Erosive gastropathy with no bleeding and no                            stigmata of recent bleeding. Biopsied.                           - Three gastric polyps.                           - Normal examined duodenum. Moderate Sedation:      Per Anesthesia Care Recommendation:           - Discharge patient to home (ambulatory).                           -  Advance diet as tolerated.                           - Await pathology results.                           - Schedule modified barium swallow. Procedure Code(s):        --- Professional ---                           [phone], Esophagogastroduodenoscopy, flexible,                            transoral; with insertion of guide wire followed by                            passage of dilator(s) through esophagus over guide                            wire Diagnosis Code(s):        --- Professional ---                           K31.89, Other diseases of  stomach and duodenum                           R13.10, Dysphagia, unspecified CPT copyright 2019 American Medical Association. All rights reserved. The codes documented in this report are preliminary and upon coder review may  be revised to meet current compliance requirements. [name], MD [name],  08/12/2019 12:12:27 PM This report has been signed electronically. Number of Addenda: 0

## 2019-08-12 NOTE — Anesthesia Postprocedure Evaluation (Signed)
Anesthesia Post Note  Patient: Zachary Craig  Procedure(s) Performed: ESOPHAGOGASTRODUODENOSCOPY (EGD) WITH PROPOFOL (N/A ) ESOPHAGEAL DILATION (N/A ) BIOPSY  Patient location during evaluation: PACU Anesthesia Type: General Level of consciousness: awake, oriented, awake and alert and patient cooperative Pain management: satisfactory to patient Vital Signs Assessment: post-procedure vital signs reviewed and stable Respiratory status: spontaneous breathing, respiratory function stable and nonlabored ventilation Cardiovascular status: stable Postop Assessment: no apparent nausea or vomiting Anesthetic complications: no   No complications documented.   Last Vitals:  Vitals:   08/12/19 1055  BP: 125/82  Pulse: 82  Resp: 18  Temp: 36.6 C  SpO2: 100%    Last Pain:  Vitals:   08/12/19 1148  TempSrc:   PainSc: 0-No pain                 GREGORY,SUZANNE

## 2019-08-12 NOTE — Anesthesia Preprocedure Evaluation (Signed)
Anesthesia Evaluation  Patient identified by MRN, date of birth, ID band Patient awake    Reviewed: Allergy & Precautions, NPO status , Patient's Chart, lab work & pertinent test results  History of Anesthesia Complications Negative for: history of anesthetic complications  Airway Mallampati: II  TM Distance: >3 FB Neck ROM: Full    Dental  (+) Dental Advisory Given   Pulmonary neg pulmonary ROS,    Pulmonary exam normal breath sounds clear to auscultation       Cardiovascular hypertension, Pt. on medications Normal cardiovascular exam Rhythm:Regular Rate:Normal  05-Aug-2019 10:56:11 [facility] ROUTINE RECORD Sinus rhythm Abnormal R-wave progression, early transition   Neuro/Psych negative neurological ROS  negative psych ROS   GI/Hepatic Neg liver ROS, GERD  Medicated and Poorly Controlled,Nausea/vomiting    Endo/Other  diabetes, Well Controlled, Type 2, Oral Hypoglycemic Agents  Renal/GU      Musculoskeletal negative musculoskeletal ROS (+)   Abdominal   Peds  Hematology negative hematology ROS (+)   Anesthesia Other Findings   Reproductive/Obstetrics negative OB ROS                             Anesthesia Physical Anesthesia Plan  ASA: II  Anesthesia Plan: General   Post-op Pain Management:    Induction: Intravenous  PONV Risk Score and Plan: 2  Airway Management Planned: Nasal Cannula, Natural Airway and Simple Face Mask  Additional Equipment:   Intra-op Plan:   Post-operative Plan:   Informed Consent: I have reviewed the patients History and Physical, chart, labs and discussed the procedure including the risks, benefits and alternatives for the proposed anesthesia with the patient or authorized representative who has indicated his/her understanding and acceptance.     Dental advisory given  Plan Discussed with: CRNA and Surgeon  Anesthesia  Plan Comments:         Anesthesia Quick Evaluation

## 2019-08-12 NOTE — Discharge Instructions (Signed)
You are being discharged to home.  Advance your diet as tolerated.  We are waiting for your pathology results. Schedule modified barium swallow.    (Left message for [facility] Office coordinator to contact patient regarding scheduling Barium Swallow.)      Upper Endoscopy, Adult, Care After This sheet gives you information about how to care for yourself after your procedure. Your health care provider may also give you more specific instructions. If you have problems or questions, contact your health care provider. What can I expect after the procedure? After the procedure, it is common to have:  A sore throat.  Mild stomach pain or discomfort.  Bloating.  Nausea. Follow these instructions at home:   Follow instructions from your health care provider about what to eat or drink after your procedure.  Return to your normal activities as told by your health care provider. Ask your health care provider what activities are safe for you.  Take over-the-counter and prescription medicines only as told by your health care provider.  Do not drive for 24 hours if you were given a sedative during your procedure.  Keep all follow-up visits as told by your health care provider. This is important. Contact a health care provider if you have:  A sore throat that lasts longer than one day.  Trouble swallowing. Get help right away if:  You vomit blood or your vomit looks like coffee grounds.  You have: ? A fever. ? Bloody, black, or tarry stools. ? A severe sore throat or you cannot swallow. ? Difficulty breathing. ? Severe pain in your chest or abdomen. Summary  After the procedure, it is common to have a sore throat, mild stomach discomfort, bloating, and nausea.  Do not drive for 24 hours if you were given a sedative during the procedure.  Follow instructions from your health care provider about what to eat or drink after your procedure.  Return to your normal activities as  told by your health care provider. This information is not intended to replace advice given to you by your health care provider. Make sure you discuss any questions you have with your health care provider. Document Revised: 06/23/2017 Document Reviewed: 06/01/2017 Elsevier Patient Education  2020 Elsevier [employer]     Monitored Anesthesia Care, Care After These instructions provide you with information about caring for yourself after your procedure. Your health care provider may also give you more specific instructions. Your treatment has been planned according to current medical practices, but problems sometimes occur. Call your health care provider if you have any problems or questions after your procedure. What can I expect after the procedure? After your procedure, you may:  Feel sleepy for several hours.  Feel clumsy and have poor balance for several hours.  Feel forgetful about what happened after the procedure.  Have poor judgment for several hours.  Feel nauseous or vomit.  Have a sore throat if you had a breathing tube during the procedure. Follow these instructions at home: For at least 24 hours after the procedure:      Have a responsible adult stay with you. It is important to have someone help care for you until you are awake and alert.  Rest as needed.  Do not: ? Participate in activities in which you could fall or become injured. ? Drive. ? Use heavy machinery. ? Drink alcohol. ? Take sleeping pills or medicines that cause drowsiness. ? Make important decisions or sign legal documents. ? Take care of  children on your own. Eating and drinking  Follow the diet that is recommended by your health care provider.  If you vomit, drink water, juice, or soup when you can drink without vomiting.  Make sure you have little or no nausea before eating solid foods. General instructions  Take over-the-counter and prescription medicines only as told by your health care  provider.  If you have sleep apnea, surgery and certain medicines can increase your risk for breathing problems. Follow instructions from your health care provider about wearing your sleep device: ? Anytime you are sleeping, including during daytime naps. ? While taking prescription pain medicines, sleeping medicines, or medicines that make you drowsy.  If you smoke, do not smoke without supervision.  Keep all follow-up visits as told by your health care provider. This is important. Contact a health care provider if:  You keep feeling nauseous or you keep vomiting.  You feel light-headed.  You develop a rash.  You have a fever. Get help right away if:  You have trouble breathing. Summary  For several hours after your procedure, you may feel sleepy and have poor judgment.  Have a responsible adult stay with you for at least 24 hours or until you are awake and alert. This information is not intended to replace advice given to you by your health care provider. Make sure you discuss any questions you have with your health care provider. Document Revised: 03/30/2017 Document Reviewed: 04/22/2015 Elsevier Patient Education  [place].

## 2019-08-12 NOTE — H&P (Signed)
Zachary Craig is an 74 y.o. male.   Chief Complaint: Dysphagia and upper abdominal discomfort after exposure to Triazicide HPI: 74 y.o. male with PMH DM, GERD and HTN, who presents for evaluation of dysphagia after recent exposure to Triazicide. the patient reported new onset of acute dysphagia and persistent nausea and vomiting after being exposed to the substance which was dispersed in there.  He has not been able to eat adequately and has lost close to 18 pounds since then.  Patient has not improved while taking PPI and states that the use of Magic mouthwash has helped him.  Past Medical History:  Diagnosis Date  . Diabetes mellitus without complication (HCC)   . Hypertension     Past Surgical History:  Procedure Laterality Date  . HIP ARTHROSCOPY Left     History reviewed. No pertinent family history. Social History:  reports that he has never smoked. He has never used smokeless tobacco. He reports that he does not drink alcohol and does not use drugs.  Allergies:  Allergies  Allergen Reactions  . Sulfa Antibiotics Itching    Medications Prior to Admission  Medication Sig Dispense Refill  . CINNAMON PO Take 1 capsule by mouth daily.    . famotidine (PEPCID) 20 MG tablet Take 1 tablet (20 mg total) by mouth 2 (two) times daily. 30 tablet 0  . losartan (COZAAR) 25 MG tablet Take 25 mg by mouth daily.    . metFORMIN (GLUCOPHAGE-XR) 500 MG 24 hr tablet Take 500 mg by mouth daily before breakfast.     . Multiple Vitamin (MULTIVITAMIN WITH MINERALS) TABS tablet Take 1 tablet by mouth daily.    Marland Kitchen omeprazole (PRILOSEC) 40 MG capsule Take 40 mg by mouth daily before breakfast.     . ondansetron (ZOFRAN) 4 MG/5ML solution Take 5 mLs (4 mg total) by mouth every 8 (eight) hours as needed for nausea or vomiting. 50 mL 2  . Probiotic Product (DIGESTIVE ADV+BOWEL SUPPORT PO) Take 2 capsules by mouth daily.     . rosuvastatin (CRESTOR) 5 MG tablet Take 5 mg by mouth every Sunday.     .  tamsulosin (FLOMAX) 0.4 MG CAPS capsule Take 0.4 mg by mouth daily.    . magic mouthwash w/lidocaine SOLN Take 5 mLs by mouth 3 (three) times daily as needed for mouth pain. Swish and spit, do not swallow (Patient not taking: Reported on 08/08/2019) 100 mL 0    No results found for this or any previous visit (from the past 48 hour(s)). No results found.  Review of Systems  Constitutional: Positive for appetite change.  HENT: Positive for sore throat and trouble swallowing.   Eyes: Negative.   Respiratory: Negative.   Cardiovascular: Negative.   Gastrointestinal: Positive for abdominal distention.  Endocrine: Negative.   Genitourinary: Negative.   Musculoskeletal: Negative.   Allergic/Immunologic: Negative.   Neurological: Negative.   Hematological: Negative.   Psychiatric/Behavioral: Negative.     Blood pressure 125/82, pulse 82, resp. rate 18, height 5\' 6"  (1.676 m), weight 81.2 kg, SpO2 100 %. Physical Exam  GENERAL: The patient is AO x3, in no acute distress. HEENT: Head is normocephalic and atraumatic. EOMI are intact. Mouth is well hydrated and without lesions. NECK: Supple. No masses or lymphadenopathy LUNGS: Clear to auscultation. No presence of rhonchi/wheezing/rales. Adequate chest expansion HEART: RRR, normal s1 and s2. ABDOMEN: Soft, nontender, no guarding, no peritoneal signs, and nondistended. BS +. No masses. EXTREMITIES: Without any cyanosis, clubbing, rash, lesions or edema. NEUROLOGIC:  AOx3, no focal motor deficit. SKIN: no jaundice, no rashes  Assessment/Plan 74 y.o. male with PMH DM, GERD and HTN, who presents for evaluation of dysphagia after recent exposure to Triazicide.  We will proceed with EGD for evaluation of new onset dysphagia and persistent nausea with vomiting, along with weight loss.  Dolores Frame, MD 08/12/2019, 11:34 AM

## 2019-08-12 NOTE — Transfer of Care (Signed)
Immediate Anesthesia Transfer of Care Note  Patient: Zachary Craig  Procedure(s) Performed: ESOPHAGOGASTRODUODENOSCOPY (EGD) WITH PROPOFOL (N/A ) ESOPHAGEAL DILATION (N/A ) BIOPSY  Patient Location: PACU  Anesthesia Type:General  Level of Consciousness: awake, alert , oriented and patient cooperative  Airway & Oxygen Therapy: Patient Spontanous Breathing  Post-op Assessment: Report given to RN, Post -op Vital signs reviewed and stable and Patient moving all extremities X 4  Post vital signs: Reviewed and stable  Last Vitals:  Vitals Value Taken Time  BP    Temp    Pulse    Resp    SpO2      Last Pain:  Vitals:   08/12/19 1148  TempSrc:   PainSc: 0-No pain      Patients Stated Pain Goal: 6 (08/12/19 1055)  Complications: No complications documented.

## 2019-08-14 ENCOUNTER — Other Ambulatory Visit: Payer: Self-pay

## 2019-08-14 ENCOUNTER — Encounter (HOSPITAL_COMMUNITY): Payer: Self-pay | Admitting: Emergency Medicine

## 2019-08-14 ENCOUNTER — Emergency Department (HOSPITAL_COMMUNITY)
Admission: EM | Admit: 2019-08-14 | Discharge: 2019-08-14 | Disposition: A | Discharge status: Home / Self Care | Payer: Medicare HMO | Attending: Emergency Medicine | Admitting: Emergency Medicine

## 2019-08-14 ENCOUNTER — Emergency Department (HOSPITAL_COMMUNITY): Payer: Medicare HMO

## 2019-08-14 DIAGNOSIS — E119 Type 2 diabetes mellitus without complications: Secondary | ICD-10-CM | POA: Diagnosis not present

## 2019-08-14 DIAGNOSIS — Z882 Allergy status to sulfonamides status: Secondary | ICD-10-CM | POA: Insufficient documentation

## 2019-08-14 DIAGNOSIS — Z7984 Long term (current) use of oral hypoglycemic drugs: Secondary | ICD-10-CM | POA: Diagnosis not present

## 2019-08-14 DIAGNOSIS — K219 Gastro-esophageal reflux disease without esophagitis: Secondary | ICD-10-CM | POA: Insufficient documentation

## 2019-08-14 DIAGNOSIS — I10 Essential (primary) hypertension: Secondary | ICD-10-CM | POA: Insufficient documentation

## 2019-08-14 DIAGNOSIS — R112 Nausea with vomiting, unspecified: Secondary | ICD-10-CM

## 2019-08-14 DIAGNOSIS — Z79899 Other long term (current) drug therapy: Secondary | ICD-10-CM | POA: Insufficient documentation

## 2019-08-14 DIAGNOSIS — I700 Atherosclerosis of aorta: Secondary | ICD-10-CM | POA: Insufficient documentation

## 2019-08-14 DIAGNOSIS — Z6828 Body mass index (BMI) 28.0-28.9, adult: Secondary | ICD-10-CM | POA: Diagnosis not present

## 2019-08-14 DIAGNOSIS — I313 Pericardial effusion (noninflammatory): Secondary | ICD-10-CM | POA: Diagnosis not present

## 2019-08-14 DIAGNOSIS — I2510 Atherosclerotic heart disease of native coronary artery without angina pectoris: Secondary | ICD-10-CM | POA: Diagnosis not present

## 2019-08-14 DIAGNOSIS — K5730 Diverticulosis of large intestine without perforation or abscess without bleeding: Secondary | ICD-10-CM | POA: Insufficient documentation

## 2019-08-14 DIAGNOSIS — R634 Abnormal weight loss: Secondary | ICD-10-CM | POA: Diagnosis not present

## 2019-08-14 LAB — TYPE AND SCREEN
ABO/RH(D): A POS
Antibody Screen: NEGATIVE

## 2019-08-14 LAB — COMPREHENSIVE METABOLIC PANEL
ALT: 13 U/L (ref 0–44)
AST: 14 U/L — ABNORMAL LOW (ref 15–41)
Albumin: 3.6 g/dL (ref 3.5–5.0)
Alkaline Phosphatase: 52 U/L (ref 38–126)
Anion gap: 9 (ref 5–15)
BUN: 9 mg/dL (ref 8–23)
CO2: 25 mmol/L (ref 22–32)
Calcium: 8.3 mg/dL — ABNORMAL LOW (ref 8.9–10.3)
Chloride: 104 mmol/L (ref 98–111)
Creatinine, Ser: 1.15 mg/dL (ref 0.61–1.24)
GFR calc Af Amer: >60 mL/min (ref >60)
GFR calc non Af Amer: >60 mL/min (ref >60)
Glucose, Bld: 112 mg/dL — ABNORMAL HIGH (ref 70–99)
Potassium: 4 mmol/L (ref 3.5–5.1)
Sodium: 138 mmol/L (ref 135–145)
Total Bilirubin: 0.6 mg/dL (ref 0.3–1.2)
Total Protein: 6.9 g/dL (ref 6.5–8.1)

## 2019-08-14 LAB — CBC
HCT: 39.5 % (ref 39.0–52.0)
Hemoglobin: 13 g/dL (ref 13.0–17.0)
MCH: 32.1 pg (ref 26.0–34.0)
MCHC: 32.9 g/dL (ref 30.0–36.0)
MCV: 97.5 fL (ref 80.0–100.0)
Platelets: 178 10*3/uL (ref 150–400)
RBC: 4.05 MIL/uL — ABNORMAL LOW (ref 4.22–5.81)
RDW: 12.8 % (ref 11.5–15.5)
WBC: 7 10*3/uL (ref 4.0–10.5)
nRBC: 0 % (ref 0.0–0.2)

## 2019-08-14 LAB — ABO/RH: ABO/RH(D): A POS

## 2019-08-14 LAB — LIPASE, BLOOD: Lipase: 20 U/L (ref 11–51)

## 2019-08-14 MED ORDER — LIDOCAINE VISCOUS HCL 2 % MT SOLN
15.0000 mL | Freq: Once | OROMUCOSAL | Status: AC
  Administered 2019-08-14: 15 mL via ORAL
  Filled 2019-08-14: qty 15

## 2019-08-14 MED ORDER — IOHEXOL 300 MG/ML  SOLN
100.0000 mL | Freq: Once | INTRAMUSCULAR | Status: AC | PRN
  Administered 2019-08-14: 100 mL via INTRAVENOUS

## 2019-08-14 MED ORDER — ONDANSETRON HCL 4 MG/2ML IJ SOLN
4.0000 mg | Freq: Once | INTRAMUSCULAR | Status: AC
  Administered 2019-08-14: 4 mg via INTRAVENOUS
  Filled 2019-08-14: qty 2

## 2019-08-14 MED ORDER — SODIUM CHLORIDE 0.9 % IV BOLUS
1000.0000 mL | Freq: Once | INTRAVENOUS | Status: AC
  Administered 2019-08-14: 1000 mL via INTRAVENOUS

## 2019-08-14 MED ORDER — ALUM & MAG HYDROXIDE-SIMETH 200-200-20 MG/5ML PO SUSP
30.0000 mL | Freq: Once | ORAL | Status: AC
  Administered 2019-08-14: 30 mL via ORAL
  Filled 2019-08-14: qty 30

## 2019-08-14 MED ORDER — SUCRALFATE 1 GM/10ML PO SUSP
1.0000 g | Freq: Three times a day (TID) | ORAL | 0 refills | Status: DC
Start: 2019-08-14 — End: 2019-09-08

## 2019-08-14 NOTE — ED Provider Notes (Signed)
[facility] EMERGENCY DEPARTMENT Provider Note   CSN: [id number] Arrival date & time: 08/14/19  1704     History Chief Complaint  Patient presents with  . Emesis    Zachary Craig is a 74 y.o. male.  HPI 74 year old male presents with vomiting.  He had an endoscopy 2 days ago as he has been having vomiting for a couple weeks with weight loss.  His emesis typically has been brown and yellow.  Did okay yesterday and was eating.  However today after eating he developed one episode of vomiting.  It was light brown.  There was report from triage that his emesis is coffee grounds but he states it has been light brown with no blood or black emesis.  He has been having no bowel movements for several days up until yesterday when he had 1 bowel movement after some medicine. He's been having a lot burning sensation going up his chest.   Past Medical History:  Diagnosis Date  . Diabetes mellitus without complication (HCC)   . Hypertension     Patient Active Problem List   Diagnosis Date Noted  . Dysphagia 08/08/2019  . GERD (gastroesophageal reflux disease) 08/08/2019    Past Surgical History:  Procedure Laterality Date  . HIP ARTHROSCOPY Left        History reviewed. No pertinent family history.  Social History   Tobacco Use  . Smoking status: Never Smoker  . Smokeless tobacco: Never Used  Vaping Use  . Vaping Use: Never used  Substance Use Topics  . Alcohol use: Never  . Drug use: Never    Home Medications Prior to Admission medications   Medication Sig Start Date End Date Taking? Authorizing Provider  CINNAMON PO Take 1 capsule by mouth daily.   Yes [provider]  famotidine (PEPCID) 20 MG tablet Take 1 tablet (20 mg total) by mouth 2 (two) times daily. 08/05/19  Yes Harlene Salts A, PA-C  losartan (COZAAR) 25 MG tablet Take 25 mg by mouth daily. 03/30/19  Yes [provider]  metFORMIN (GLUCOPHAGE-XR) 500 MG 24 hr tablet Take 500 mg by mouth daily  before breakfast.  04/23/19  Yes [provider]  omeprazole (PRILOSEC) 40 MG capsule Take 40 mg by mouth daily before breakfast.  02/24/19  Yes [provider]  ondansetron (ZOFRAN) 4 MG/5ML solution Take 5 mLs (4 mg total) by mouth every 8 (eight) hours as needed for nausea or vomiting. 08/08/19  Yes Marguerita Merles, Reuel Boom, MD  Probiotic Product (DIGESTIVE ADV+BOWEL SUPPORT PO) Take 2 capsules by mouth daily.    Yes [provider]  rosuvastatin (CRESTOR) 5 MG tablet Take 5 mg by mouth every Sunday.  05/20/19  Yes [provider]  tamsulosin (FLOMAX) 0.4 MG CAPS capsule Take 0.4 mg by mouth daily. 07/11/19  Yes [provider]  magic mouthwash w/lidocaine SOLN Take 5 mLs by mouth 3 (three) times daily as needed for mouth pain. Swish and spit, do not swallow Patient not taking: Reported on 08/08/2019 07/26/19   Triplett, Tammy, PA-C  sucralfate (CARAFATE) 1 GM/10ML suspension Take 10 mLs (1 g total) by mouth 4 (four) times daily -  with meals and at bedtime. 08/14/19   Pricilla Loveless, MD    Allergies    Sulfa antibiotics  Review of Systems   Review of Systems  Constitutional: Negative for fever.  Cardiovascular: Positive for chest pain.  Gastrointestinal: Positive for constipation, nausea and vomiting. Negative for abdominal pain.  All other systems  reviewed and are negative.   Physical Exam Updated Vital Signs BP (!) 140/79   Pulse 77   Temp 98 F (36.7 C) (Oral)   Resp 19   Ht 5\' 6"  (1.676 m)   Wt 81.2 kg   SpO2 100%   BMI 28.89 kg/m   Physical Exam Vitals and nursing note reviewed.  Constitutional:      General: He is not in acute distress.    Appearance: He is well-developed. He is not ill-appearing or diaphoretic.     Comments: Patient showed me a bottle containing his emesis, it is light brown without signs of bleeding  HENT:     Head: Normocephalic and atraumatic.     Right Ear: External ear normal.     Left Ear: External ear  normal.     Nose: Nose normal.  Eyes:     General:        Right eye: No discharge.        Left eye: No discharge.  Cardiovascular:     Rate and Rhythm: Normal rate and regular rhythm.     Heart sounds: Normal heart sounds.  Pulmonary:     Effort: Pulmonary effort is normal.     Breath sounds: Normal breath sounds.  Abdominal:     General: There is no distension.     Palpations: Abdomen is soft.     Tenderness: There is no abdominal tenderness.  Musculoskeletal:     Cervical back: Neck supple.  Skin:    General: Skin is warm and dry.  Neurological:     Mental Status: He is alert.  Psychiatric:        Mood and Affect: Mood is not anxious.     ED Results / Procedures / Treatments   Labs (all labs ordered are listed, but only abnormal results are displayed) Labs Reviewed  COMPREHENSIVE METABOLIC PANEL - Abnormal; Notable for the following components:      Result Value   Glucose, Bld 112 (*)    Calcium 8.3 (*)    AST 14 (*)    All other components within normal limits  CBC - Abnormal; Notable for the following components:   RBC 4.05 (*)    All other components within normal limits  LIPASE, BLOOD  TYPE AND SCREEN  ABO/RH    EKG EKG Interpretation  Date/Time:  Sunday August 14 2019 18:46:02 EDT Ventricular Rate:  69 PR Interval:    QRS Duration: 93 QT Interval:  408 QTC Calculation: 438 R Axis:   -10 Text Interpretation: Sinus rhythm Abnormal R-wave progression, early transition no acute ST/T changes similar to August 05 2019 Confirmed by 07-15-1975 [id number]) on 08/14/2019 8:17:48 PM   Radiology CT ABDOMEN PELVIS W CONTRAST  Result Date: 08/14/2019 CLINICAL DATA:  Nausea, vomiting EXAM: CT ABDOMEN AND PELVIS WITH CONTRAST TECHNIQUE: Multidetector CT imaging of the abdomen and pelvis was performed using the standard protocol following bolus administration of intravenous contrast. CONTRAST:  10/14/2019 OMNIPAQUE IOHEXOL 300 MG/ML  SOLN COMPARISON:  CT abdomen pelvis  04/16/2012 (report only, images unavailable) FINDINGS: Lower chest: Atelectatic changes in the otherwise clear lung bases. Normal cardiac size. Small pericardial effusion. Few coronary artery calcifications are noted. Hepatobiliary: No worrisome focal liver lesions. Smooth liver surface contour. Normal hepatic attenuation. Gallbladder and biliary tree are unremarkable. No visible calcified gallstones. Pancreas: Unremarkable. No pancreatic ductal dilatation or surrounding inflammatory changes. Spleen: Normal in size. No concerning splenic lesions. Adrenals/Urinary Tract: Normal adrenal glands. Kidneys enhance and excrete  symmetrically. 3 cm fluid attenuation cyst present in the lower pole left kidney without concerning CT features. Additional scattered subcentimeter hypertension foci in both kidneys too small to fully characterize on CT imaging but statistically likely benign. no concerning renal mass, urolithiasis or hydronephrosis. Physiologic bladder distension. No gross bladder abnormality though partially obscured by streak artifact from left hip hardware. Stomach/Bowel: Distal esophagus, stomach and duodenal sweep are unremarkable. No small bowel wall thickening or dilatation. No evidence of obstruction. A normal appendix is visualized. No colonic dilatation or wall thickening. Scattered distal colonic diverticula without focal inflammation to suggest diverticulitis. Vascular/Lymphatic: Atherosclerotic calcifications within the abdominal aorta and branch vessels. No aneurysm or ectasia. No enlarged abdominopelvic lymph nodes. Reproductive: No gross abnormality the prostate or seminal vesicles though partially obscured by streak artifact from marker prosthesis. Other: No abdominopelvic free fluid or free gas. No bowel containing hernias. Tiny fat containing umbilical hernia without inflammation. Musculoskeletal: No acute osseous abnormality or suspicious osseous lesion. Multilevel degenerative changes are  present in the imaged portions of the spine. Few benign-appearing lucent lesions in the left ilium with narrow zone of transition. Extensive sclerotic and cystic changes of the right femoral head with articular surface collapse compatible with sequela of prior avascular necrosis. Milder bony remodeling along the acetabular side of the joint. Postsurgical changes from prior total left hip arthroplasty. IMPRESSION: 1. No acute intra-abdominal process to provide cause for patient's symptoms. 2. Colonic diverticulosis without evidence of diverticulitis. 3. Small pericardial effusion. 4. Avascular necrosis of the right femoral head. 5. 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 6. Coronary artery calcifications are present. Please note that the presence of coronary artery calcium documents the presence of coronary artery disease, the severity of this disease and any potential stenosis cannot be assessed on this non-gated CT examination. 7. Aortic Atherosclerosis (ICD10-I70.0). Electronically Signed   By: Kreg ShropshirePrice  DeHay M.D.   On: 08/14/2019 22:06    Procedures Procedures (including critical care time)  Medications Ordered in ED Medications  alum & mag hydroxide-simeth (MAALOX/MYLANTA) 200-200-20 MG/5ML suspension 30 mL (60 mLs Oral Incomplete 08/14/19 2326)    And  lidocaine (XYLOCAINE) 2 % viscous mouth solution 15 mL (has no administration in time range)  sodium chloride 0.9 % bolus 1,000 mL (0 mLs Intravenous Stopped 08/14/19 2016)  ondansetron (ZOFRAN) injection 4 mg (4 mg Intravenous Given 08/14/19 1858)  iohexol (OMNIPAQUE) 300 MG/ML solution 100 mL (100 mLs Intravenous Contrast Given 08/14/19 2135)    ED Course  I have reviewed the triage vital signs and the nursing notes.  Pertinent labs & imaging results that were available during my care of the patient were reviewed by me and considered in my medical decision making (see chart for details).    MDM Rules/Calculators/A&P                           Patient has not had any vomiting in the multiple hours he has been in the emergency department.  This is a subacute problem.  CT obtained given fairly benign EGD couple days ago.  There are some coronary artery calcifications but this does not sound like an anginal problem.  I did offer to send a troponin though he has been here long enough that he now will just wants to leave.  His pain sounds like reflux.  He has already set up an outpatient follow-up with a cardiologist and would like to keep this.  Advised him  to follow-up with his gastroenterologist.  I will give him Carafate in addition to his Pepcid and omeprazole. He has zofran.  Originally the nursing notes indicate this might be a possible bleed but the emesis viewed by myself does not look bloody Final Clinical Impression(s) / ED Diagnoses Final diagnoses:  Non-intractable vomiting with nausea, unspecified vomiting type  Coronary artery disease involving native heart without angina pectoris, unspecified vessel or lesion type    Rx / DC Orders ED Discharge Orders         Ordered    sucralfate (CARAFATE) 1 GM/10ML suspension  3 times daily with meals & bedtime     Discontinue  Reprint     08/14/19 2323           Pricilla Loveless, MD 08/14/19 2335

## 2019-08-14 NOTE — Discharge Instructions (Signed)
If you develop worsening, continued, or recurrent abdominal pain, uncontrolled vomiting, fever, chest or back pain, or any other new/concerning symptoms then return to the ER for evaluation.   Your CT scan shows coronary artery calcifications.  It is important to follow-up with a cardiologist to have this further assessed.

## 2019-08-14 NOTE — ED Notes (Signed)
Patient transported to CT 

## 2019-08-14 NOTE — ED Triage Notes (Signed)
Pt had a upper endoscopy on Friday and is vomiting today. Vomit is characterized as looking like coffee grounds.

## 2019-08-15 ENCOUNTER — Other Ambulatory Visit: Payer: Self-pay

## 2019-08-15 ENCOUNTER — Other Ambulatory Visit (INDEPENDENT_AMBULATORY_CARE_PROVIDER_SITE_OTHER): Payer: Self-pay | Admitting: Gastroenterology

## 2019-08-15 DIAGNOSIS — A048 Other specified bacterial intestinal infections: Secondary | ICD-10-CM | POA: Insufficient documentation

## 2019-08-15 DIAGNOSIS — R1310 Dysphagia, unspecified: Secondary | ICD-10-CM

## 2019-08-15 LAB — SURGICAL PATHOLOGY

## 2019-08-15 MED ORDER — OMEPRAZOLE 40 MG PO CPDR: 40.0000 mg | DELAYED_RELEASE_CAPSULE | Freq: Two times a day (BID) | ORAL | 0 refills | Status: DC

## 2019-08-15 MED ORDER — CLARITHROMYCIN 500 MG PO TABS: 500.0000 mg | ORAL_TABLET | Freq: Two times a day (BID) | ORAL | 0 refills | Status: AC

## 2019-08-15 MED ORDER — AMOXICILLIN 500 MG PO CAPS: 1000.0000 mg | ORAL_CAPSULE | Freq: Two times a day (BID) | ORAL | 0 refills | Status: AC

## 2019-08-16 ENCOUNTER — Encounter (HOSPITAL_COMMUNITY): Payer: Self-pay | Admitting: Gastroenterology

## 2019-08-16 ENCOUNTER — Telehealth (INDEPENDENT_AMBULATORY_CARE_PROVIDER_SITE_OTHER): Payer: Self-pay | Admitting: *Deleted

## 2019-08-16 ENCOUNTER — Telehealth: Payer: Self-pay | Admitting: *Deleted

## 2019-08-16 NOTE — Telephone Encounter (Signed)
Patient called - med Dr C called in has reaction to another of patient's meds please call patient - I did transfer her to your VM so she could leave details

## 2019-08-16 NOTE — Telephone Encounter (Signed)
NOTED

## 2019-08-16 NOTE — Telephone Encounter (Signed)
Pharmacy called related to Rx: Carafate suspension being very costly and requested permission to change tablets .Marland KitchenMarland KitchenEDCM clarified with EDP ([facility]) to change Rx to: tablets.   Pharmacy also called related to Rx: clarithromycin drug interaction with flomax and requested permission to educated pt to stop flomax while on clarithomycin .Marland KitchenMarland KitchenEDCM clarified with EDP  to change approve request.

## 2019-08-16 NOTE — Telephone Encounter (Signed)
This may be a duplicate to you regarding this issue. Patient's wife has called again and ask that we let them or their pharmacy know what to do about this medication that you prescribed him yesterday.

## 2019-08-16 NOTE — Telephone Encounter (Signed)
Spoke with pharmacy and patient, given interaction between Flagyl and Flomax I will change the Rx to quadruple regimen (bismuth, omeprazole, flagyl and tetracycline. The patient agreed and understood.  [name], MD Gastroenterology and Hepatology [facility]

## 2019-08-17 ENCOUNTER — Other Ambulatory Visit (INDEPENDENT_AMBULATORY_CARE_PROVIDER_SITE_OTHER): Payer: Self-pay | Admitting: Gastroenterology

## 2019-08-17 ENCOUNTER — Telehealth (INDEPENDENT_AMBULATORY_CARE_PROVIDER_SITE_OTHER): Payer: Self-pay | Admitting: Gastroenterology

## 2019-08-17 ENCOUNTER — Ambulatory Visit (HOSPITAL_COMMUNITY)
Admission: RE | Admit: 2019-08-17 | Discharge: 2019-08-17 | Disposition: A | Discharge status: Home / Self Care | Payer: Medicare HMO | Source: Ambulatory Visit | Attending: Gastroenterology | Admitting: Gastroenterology

## 2019-08-17 ENCOUNTER — Other Ambulatory Visit: Payer: Self-pay

## 2019-08-17 DIAGNOSIS — R1310 Dysphagia, unspecified: Secondary | ICD-10-CM | POA: Insufficient documentation

## 2019-08-17 DIAGNOSIS — R1319 Other dysphagia: Secondary | ICD-10-CM

## 2019-08-17 NOTE — Telephone Encounter (Signed)
I spoke with the wife today regarding the results of his esophagram.  She reported that he was feeling better and was able to advance his diet for the last couple of days.  Has not presented any more vomiting episodes.  He will take his prescribed regimen for H. pylori.  For now, we will hold on performing an esophageal manometry as he has had some symptom improvement.  I consider his symptoms could be related to the exposure to the chemical and he is improving now as he their effects may be weaning off.  If his symptoms recur or worsen we'll need to proceed with esophageal manometry.  [name], MD Gastroenterology and Hepatology [facility]

## 2019-08-29 ENCOUNTER — Other Ambulatory Visit (HOSPITAL_COMMUNITY): Payer: Self-pay | Admitting: Specialist

## 2019-08-29 DIAGNOSIS — K219 Gastro-esophageal reflux disease without esophagitis: Secondary | ICD-10-CM

## 2019-08-29 DIAGNOSIS — R1312 Dysphagia, oropharyngeal phase: Secondary | ICD-10-CM

## 2019-09-01 ENCOUNTER — Ambulatory Visit (HOSPITAL_COMMUNITY)
Admission: RE | Admit: 2019-09-01 | Discharge: 2019-09-01 | Disposition: A | Discharge status: Home / Self Care | Payer: Medicare HMO | Source: Ambulatory Visit | Attending: Internal Medicine | Admitting: Internal Medicine

## 2019-09-01 ENCOUNTER — Ambulatory Visit (HOSPITAL_COMMUNITY): Payer: Medicare HMO | Attending: Internal Medicine | Admitting: Speech Pathology

## 2019-09-01 ENCOUNTER — Other Ambulatory Visit: Payer: Self-pay

## 2019-09-01 DIAGNOSIS — R1310 Dysphagia, unspecified: Secondary | ICD-10-CM | POA: Diagnosis not present

## 2019-09-01 DIAGNOSIS — K219 Gastro-esophageal reflux disease without esophagitis: Secondary | ICD-10-CM | POA: Insufficient documentation

## 2019-09-01 DIAGNOSIS — R1312 Dysphagia, oropharyngeal phase: Secondary | ICD-10-CM

## 2019-09-01 NOTE — Therapy (Signed)
[facility] [address] [place], [place], [address] Phone: [phone]   Fax:  [phone]  Modified Barium Swallow  Patient Details  Name: Zachary Craig MRN: [id number] Date of Birth: January 17, 1945 No data recorded  Encounter Date: 09/01/2019   End of Session - 09/01/19 1451    Visit Number 1    Number of Visits 1    SLP Start Time 1341    SLP Stop Time  1359    SLP Time Calculation (min) 18 min    Activity Tolerance Patient tolerated treatment well           Past Medical History:  Diagnosis Date  . Diabetes mellitus without complication (HCC)   . Hypertension     Past Surgical History:  Procedure Laterality Date  . BIOPSY  08/12/2019   Procedure: BIOPSY;  Surgeon: Dolores Frame, MD;  [facility];  Service: Gastroenterology;;  gastric   . ESOPHAGEAL DILATION N/A 08/12/2019   Procedure: ESOPHAGEAL DILATION;  Surgeon: Dolores Frame, MD;  [facility];  Service: Gastroenterology;  Laterality: N/A;  . ESOPHAGOGASTRODUODENOSCOPY (EGD) WITH PROPOFOL N/A 08/12/2019   Procedure: ESOPHAGOGASTRODUODENOSCOPY (EGD) WITH PROPOFOL;  Surgeon: Dolores Frame, MD;  [facility];  Service: Gastroenterology;  Laterality: N/A;  245  . HIP ARTHROSCOPY Left     There were no vitals filed for this visit.     HPI: 74 y.o. male with PMH DM, GERD and HTN, who presented to the ED 08/12/3019 for evaluation of dysphagia after recent exposure to Triazicide. the patient reported new onset of acute dysphagia and persistent nausea and vomiting after being exposed to the substance which was dispersed in there.  He has not been able to eat adequately and has lost close to 18 pounds since then.  Patient has not improved while taking PPI and states that the use of Magic mouthwash has helped him. Recent gastroenterology note reports "Pt has not presented any more vomiting episodes.  He will take his  prescribed regimen for H. pylori. For now, we will hold on performing an esophageal manometry as he has had some symptom improvement.  I consider his symptoms could be related to the exposure to the chemical and he is improving now as he their effects may be weaning off." MBSS was requested to objectively assess the swallowing function.      Assessment / Plan / Recommendation  CHL IP CLINICAL IMPRESSIONS 09/01/2019  Clinical Impression Pt's oropharyngeal swallowing was noted to be grossly WNL; Pt consumed thin liquids, puree and regular textures without penetration or aspiration visualized. Note good hyolaryngeal excursion, good laryngeal vestibule closure and adequate pharyngeal squeeze. Note brief stasis of the barium tablet in the upper esophagus requiring additional bites of puree textures to facilitate passing through. Recommend continue with current diet (to advance as able per GI note) and defer to gastroenterology for further esophageal recommendations. There are no further ST needs noted at this time. Thank you for this referral.   SLP Visit Diagnosis Dysphagia, unspecified (R13.10)  Attention and concentration deficit following --  Frontal lobe and executive function deficit following --  Impact on safety and function Mild aspiration risk      CHL IP TREATMENT RECOMMENDATION 09/01/2019  Treatment Recommendations No treatment recommended at this time     No flowsheet data found.  CHL IP DIET RECOMMENDATION 09/01/2019  SLP Diet Recommendations Dysphagia 3 (Mech soft) solids;Thin liquid  Liquid Administration via Cup;Straw  Medication  Administration Whole meds with liquid  Compensations Slow rate;Small sips/bites  Postural Changes Seated upright at 90 degrees;Remain semi-upright after after feeds/meals (Comment)      CHL IP OTHER RECOMMENDATIONS 09/01/2019  Recommended Consults --  Oral Care Recommendations Oral care BID  Other Recommendations --      CHL IP FOLLOW UP  RECOMMENDATIONS 09/01/2019  Follow up Recommendations None      No flowsheet data found.         CHL IP ORAL PHASE 09/01/2019  Oral Phase WFL  Oral - Pudding Teaspoon --  Oral - Pudding Cup --  Oral - Honey Teaspoon --  Oral - Honey Cup --  Oral - Nectar Teaspoon --  Oral - Nectar Cup --  Oral - Nectar Straw --  Oral - Thin Teaspoon --  Oral - Thin Cup --  Oral - Thin Straw --  Oral - Puree --  Oral - Mech Soft --  Oral - Regular --  Oral - Multi-Consistency --  Oral - Pill --  Oral Phase - Comment --    CHL IP PHARYNGEAL PHASE 09/01/2019  Pharyngeal Phase WFL  Pharyngeal- Pudding Teaspoon --  Pharyngeal --  Pharyngeal- Pudding Cup --  Pharyngeal --  Pharyngeal- Honey Teaspoon --  Pharyngeal --  Pharyngeal- Honey Cup --  Pharyngeal --  Pharyngeal- Nectar Teaspoon --  Pharyngeal --  Pharyngeal- Nectar Cup --  Pharyngeal --  Pharyngeal- Nectar Straw --  Pharyngeal --  Pharyngeal- Thin Teaspoon --  Pharyngeal --  Pharyngeal- Thin Cup --  Pharyngeal --  Pharyngeal- Thin Straw --  Pharyngeal --  Pharyngeal- Puree --  Pharyngeal --  Pharyngeal- Mechanical Soft --  Pharyngeal --  Pharyngeal- Regular --  Pharyngeal --  Pharyngeal- Multi-consistency --  Pharyngeal --  Pharyngeal- Pill --  Pharyngeal --  Pharyngeal Comment --     No flowsheet data found.  Mizael Sagar H. Romie Levee, CCC-SLP Speech Language Pathologist  Georgetta Haber 09/01/2019, 2:53 PM                                              Patient will benefit from skilled therapeutic intervention in order to improve the following deficits and impairments:   Dysphagia, unspecified type        Problem List Patient Active Problem List   Diagnosis Date Noted  . Helicobacter pylori (H. pylori) infection 08/15/2019  . Dysphagia 08/08/2019  . GERD (gastroesophageal reflux disease) 08/08/2019    Georgetta Haber 09/01/2019, 2:53 PM  [facility] [address] [place], [place], [address] Phone: [phone]   Fax:  [phone]  Name: Zachary Craig MRN: [id number] Date of Birth: [birth year]

## 2019-09-07 ENCOUNTER — Encounter: Payer: Self-pay | Admitting: *Deleted

## 2019-09-07 NOTE — Progress Notes (Signed)
Cardiology Office Note  Date: 09/08/2019   ID: Zachary Craig, DOB 1945/03/14, MRN [id number]  PCP:  [name], MD  Cardiologist:  Nona Dell, MD Electrophysiologist:  None   Chief Complaint  Patient presents with  . Atherosclerosis by CT    History of Present Illness: Zachary Craig is a 74 y.o. male referred for cardiology consultation by Ms. Barham DNP with Dr. Sherril Croon with findings of atherosclerosis by CT imaging.  I reviewed available records.  Records indicate ER visit in early August with emesis.  He has had trouble with dysphagia and recently underwent EGD by Dr. Levon Hedger. Subsequently treated for Helicobacter pylori. CT of the abdomen and pelvis back in August did incidentally mention presence of coronary artery calcification as well as aortic atherosclerosis and a small pericardial effusion.  He does not describe any clear-cut angina worsening shortness of breath with typical ADLs.  His above GI symptoms are settling down by his report, I reviewed his present medications which are listed below.  He has been taking Prilosec and also a probiotic.  He does have a history of type 2 diabetes mellitus, reports hemoglobin A1c under 7% most recently.  He also has an LDL of 115, currently taking Crestor 5 mg once weekly per PCP.  I reviewed his ECG from August.  Past Medical History:  Diagnosis Date  . BPH (benign prostatic hyperplasia)   . Diverticulosis   . Essential hypertension   . GERD (gastroesophageal reflux disease)   . History of URI (upper respiratory infection)   . Mixed hyperlipidemia   . Type 2 diabetes mellitus (HCC)     Past Surgical History:  Procedure Laterality Date  . BIOPSY  08/12/2019   Procedure: BIOPSY;  Surgeon: Dolores Frame, MD;  [facility];  Service: Gastroenterology;;  gastric   . ESOPHAGEAL DILATION N/A 08/12/2019   Procedure: ESOPHAGEAL DILATION;  Surgeon: Dolores Frame, MD;  [facility];   Service: Gastroenterology;  Laterality: N/A;  . ESOPHAGOGASTRODUODENOSCOPY (EGD) WITH PROPOFOL N/A 08/12/2019   Procedure: ESOPHAGOGASTRODUODENOSCOPY (EGD) WITH PROPOFOL;  Surgeon: Dolores Frame, MD;  [facility];  Service: Gastroenterology;  Laterality: N/A;  245  . HIP ARTHROSCOPY Left     Current Outpatient Medications  Medication Sig Dispense Refill  . CINNAMON PO Take 1 capsule by mouth daily.    . famotidine (PEPCID) 20 MG tablet Take 1 tablet (20 mg total) by mouth 2 (two) times daily. 30 tablet 0  . losartan (COZAAR) 25 MG tablet Take 25 mg by mouth daily.    . metFORMIN (GLUCOPHAGE-XR) 500 MG 24 hr tablet Take 500 mg by mouth daily before breakfast.     . Probiotic Product (DIGESTIVE ADV+BOWEL SUPPORT PO) Take 2 capsules by mouth daily.     . rosuvastatin (CRESTOR) 5 MG tablet Take 5 mg by mouth every Sunday.     . tamsulosin (FLOMAX) 0.4 MG CAPS capsule Take 0.4 mg by mouth daily.    Marland Kitchen omeprazole (PRILOSEC) 40 MG capsule Take 1 capsule (40 mg total) by mouth in the morning and at bedtime for 14 days. 28 capsule 0   No current facility-administered medications for this visit.   Allergies:  Sulfa antibiotics   Social History: The patient  reports that he has never smoked. He has never used smokeless tobacco. He reports that he does not drink alcohol and does not use drugs.   Family History: The patient's family history includes Diabetes Mellitus II in  his mother; Heart attack in his father.   ROS:   No palpitations or syncope.  Physical Exam: VS:  BP 110/80   Pulse 95   Ht 5\' 6"  (1.676 m)   Wt 176 lb 12.8 oz (80.2 kg)   SpO2 98%   BMI 28.54 kg/m , BMI Body mass index is 28.54 kg/m.  Wt Readings from Last 3 Encounters:  09/08/19 176 lb 12.8 oz (80.2 kg)  08/14/19 179 lb 0.2 oz (81.2 kg)  08/12/19 179 lb 0.2 oz (81.2 kg)    General: Patient appears comfortable at rest. HEENT: Conjunctiva and lids normal, wearing a mask. Neck: Supple, no  elevated JVP or carotid bruits. Lungs: Clear to auscultation, nonlabored breathing at rest. Cardiac: Regular rate and rhythm, no S3 or significant systolic murmur, no pericardial rub. Abdomen: Soft, bowel sounds present, no guarding or rebound. Extremities: No pitting edema, distal pulses 2+. Skin: Warm and dry. Musculoskeletal: No kyphosis. Neuropsychiatric: Alert and oriented x3, affect grossly appropriate.  ECG:  An ECG dated 08/14/2019 was personally reviewed today and demonstrated:  Sinus rhythm with early transition.  Recent Labwork: 08/14/2019: ALT 13; AST 14; BUN 9; Creatinine, Ser 1.15; Hemoglobin 13.0; Platelets 178; Potassium 4.0; Sodium 138  July 2021: Hemoglobin A1c 6.8% February 2021: TSH 1.36, AST 18, ALT 9, cholesterol 180, triglycerides 125, HDL 42, LDL 115, hemoglobin 14.6, platelets 262  Other Studies Reviewed Today:  CT abdomen and pelvis 08/14/2019: IMPRESSION: 1. No acute intra-abdominal process to provide cause for patient's symptoms. 2. Colonic diverticulosis without evidence of diverticulitis. 3. Small pericardial effusion. 4. Avascular necrosis of the right femoral head. 5. 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 6. Coronary artery calcifications are present. Please note that the presence of coronary artery calcium documents the presence of coronary artery disease, the severity of this disease and any potential stenosis cannot be assessed on this non-gated CT examination. 7. Aortic Atherosclerosis (ICD10-I70.0).  Assessment and Plan:  1.  Coronary artery calcification by recent CT imaging as outlined above.  Patient has a history of type 2 diabetes mellitus and mild hyperlipidemia, no definite angina symptoms reported.  He has not undergone any formal ischemic testing and we will proceed with a Lexiscan Myoview for evaluation.  I reviewed his ECG which is overall nonspecific at baseline.  2.  Type 2 diabetes mellitus, on  Metformin with follow-up by PCP and recent hemoglobin A1c of 6.8%.  3.  Mild hyperlipidemia, LDL 115.  He is on Crestor 5 mg once weekly per PCP.  If tolerated, consider increasing dose to get LDL closer to 70.  4.  Aortic atherosclerosis, no abdominal aortic aneurysm.  He is on statin therapy.  Medication Adjustments/Labs and Tests Ordered: Current medicines are reviewed at length with the patient today.  Concerns regarding medicines are outlined above.   Tests Ordered: Orders Placed This Encounter  Procedures  . NM Myocar Multi W/Spect W/Wall Motion / EF    Medication Changes: No orders of the defined types were placed in this encounter.   Disposition:  Follow up test results.  Signed, 10/14/2019, MD, [facility] 09/08/2019 9:27 AM    [facility] [address] [place], [place], [place] Phone: [phone]; Fax: [phone]

## 2019-09-08 ENCOUNTER — Encounter: Payer: Self-pay | Admitting: Cardiology

## 2019-09-08 ENCOUNTER — Encounter: Payer: Self-pay | Admitting: *Deleted

## 2019-09-08 ENCOUNTER — Other Ambulatory Visit: Payer: Self-pay

## 2019-09-08 ENCOUNTER — Other Ambulatory Visit (INDEPENDENT_AMBULATORY_CARE_PROVIDER_SITE_OTHER): Payer: Self-pay | Admitting: Gastroenterology

## 2019-09-08 ENCOUNTER — Ambulatory Visit: Payer: Medicare HMO | Admitting: Cardiology

## 2019-09-08 VITALS — BP 110/80 | HR 95 | Ht 66.0 in | Wt 176.8 lb

## 2019-09-08 DIAGNOSIS — E119 Type 2 diabetes mellitus without complications: Secondary | ICD-10-CM | POA: Diagnosis not present

## 2019-09-08 DIAGNOSIS — I2510 Atherosclerotic heart disease of native coronary artery without angina pectoris: Secondary | ICD-10-CM

## 2019-09-08 DIAGNOSIS — I700 Atherosclerosis of aorta: Secondary | ICD-10-CM

## 2019-09-08 DIAGNOSIS — A048 Other specified bacterial intestinal infections: Secondary | ICD-10-CM

## 2019-09-08 DIAGNOSIS — Z87898 Personal history of other specified conditions: Secondary | ICD-10-CM

## 2019-09-08 DIAGNOSIS — E782 Mixed hyperlipidemia: Secondary | ICD-10-CM

## 2019-09-08 NOTE — Patient Instructions (Addendum)
Medication Instructions:   Your physician recommends that you continue on your current medications as directed. Please refer to the Current Medication list given to you today.  Labwork:  NONE  Testing/Procedures: Your physician has requested that you have a lexiscan myoview. For further information please visit [email or web address]. Please follow instruction sheet, as given.  Follow-Up:  Your physician recommends that you schedule a follow-up appointment in: pending.  Any Other Special Instructions Will Be Listed Below (If Applicable).  If you need a refill on your cardiac medications before your next appointment, please call your pharmacy. 

## 2019-09-12 ENCOUNTER — Ambulatory Visit (INDEPENDENT_AMBULATORY_CARE_PROVIDER_SITE_OTHER): Payer: Medicare HMO | Admitting: Gastroenterology

## 2019-09-13 ENCOUNTER — Encounter (INDEPENDENT_AMBULATORY_CARE_PROVIDER_SITE_OTHER): Payer: Self-pay | Admitting: Gastroenterology

## 2019-09-13 ENCOUNTER — Other Ambulatory Visit: Payer: Self-pay

## 2019-09-13 ENCOUNTER — Ambulatory Visit (INDEPENDENT_AMBULATORY_CARE_PROVIDER_SITE_OTHER): Payer: Medicare HMO | Admitting: Gastroenterology

## 2019-09-13 DIAGNOSIS — R1013 Epigastric pain: Secondary | ICD-10-CM | POA: Diagnosis not present

## 2019-09-13 DIAGNOSIS — R6881 Early satiety: Secondary | ICD-10-CM | POA: Diagnosis not present

## 2019-09-13 NOTE — Patient Instructions (Signed)
Schedule gastric emptying study Advance diet as tolerated Start taking Miralax 1 cap every day for one week. If bowel movements do not improve, increase to 1 cup every 12 hours. If after two weeks there is no improvement, increase to 1 cup every 8 hours Stop Dulcolax

## 2019-09-13 NOTE — Progress Notes (Signed)
Zachary Craig, M.D. Gastroenterology & Hepatology [facility] [address] [place], [place] [address]  Primary Care Physician: [name], MD [address] [place] [address]  I will communicate my assessment and recommendations to the referring MD via EMR. "Note: Occasional unusual wording and randomly placed punctuation marks may result from the use of speech recognition technology to transcribe this document"  Problems: 1. Abdominal discomfort and fullness 2. Constipation 3. History of H. pylori status post quadruple regimen 4. History of exposure to Triazicide  History of Present Illness: Zachary Craig is a 74 y.o. male with PMH DM, GERD and HTN, who presents for  follow up of upper abdominal discomfort and fullness sensation after eating food, and constipation.  The patient was last seen on 08/08/2019. At that time, the patient was presenting new onset symptoms of dysphagia, nausea, vomiting and epigastric discomfort after being exposed to Triazicide.  The patient underwent extensive investigations which included a CT of the abdomen and pelvis with IV contrast that only showed diverticulosis and a small pericardial effusion, as well as coronary artery calcifications but no other acute alterations. 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  Modified barium swallow was within normal limits.  EGD showed presence of a normal esophagus which was empirically dilated with resolution of dysphagia, there was erosive gastropathy that was biopsied, results were positive for H. pylori.  The patient was prescribed quadruple regimen which she took compliantly.  The patient states that overall his symptoms have improved since the last time he saw [place] in clinic.  He states that his dysphagia is gone and he is able to tolerate more food, which is usually bland and soft in consistency.  He has not tried any spicy  food or heart meats.  He is interested in advancing his diet.  He reports that he is still having some upper discomfort after eating food which she describes as if the "food was taking longer to go down and he sustained in the upper part of his abdomen".  He feels full easily after eating.  Denies any nausea or vomiting, abdominal distention or pain, hematemesis, melena or hematochezia.  His weight has been stable since the last time he saw [place] in the clinic.  Notably, he has noticed having some constipation recently for which she is currently taking Dulcolax every 2 days to have a bowel movement as Metamucil has not worked for this.   Past Medical History: Past Medical History:  Diagnosis Date  . BPH (benign prostatic hyperplasia)   . Diverticulosis   . Essential hypertension   . GERD (gastroesophageal reflux disease)   . History of URI (upper respiratory infection)   . Mixed hyperlipidemia   . Type 2 diabetes mellitus (HCC)     Past Surgical History: Past Surgical History:  Procedure Laterality Date  . BIOPSY  08/12/2019   Procedure: BIOPSY;  Surgeon: Dolores Frame, MD;  [facility];  Service: Gastroenterology;;  gastric   . ESOPHAGEAL DILATION N/A 08/12/2019   Procedure: ESOPHAGEAL DILATION;  Surgeon: Dolores Frame, MD;  [facility];  Service: Gastroenterology;  Laterality: N/A;  . ESOPHAGOGASTRODUODENOSCOPY (EGD) WITH PROPOFOL N/A 08/12/2019   Procedure: ESOPHAGOGASTRODUODENOSCOPY (EGD) WITH PROPOFOL;  Surgeon: Dolores Frame, MD;  [facility];  Service: Gastroenterology;  Laterality: N/A;  245  . HIP ARTHROSCOPY Left     Family History: Family History  Problem Relation Age of  Onset  . Diabetes Mellitus II Mother   . Heart attack Father     Social History: Social History   Tobacco Use  Smoking Status Never Smoker  Smokeless Tobacco Never Used   Social History   Substance and Sexual Activity  Alcohol Use  Never   Social History   Substance and Sexual Activity  Drug Use Never    Allergies: Allergies  Allergen Reactions  . Sulfa Antibiotics Itching    Medications: Current Outpatient Medications  Medication Sig Dispense Refill  . omeprazole (PRILOSEC) 40 MG capsule TAKE 1 CAPSULE BY MOUTH IN THE MORNING AND AT BEDTIME FOR 14 DAYS 28 capsule 0  . Probiotic Product (DIGESTIVE ADV+BOWEL SUPPORT PO) Take 2 capsules by mouth daily.     . rosuvastatin (CRESTOR) 5 MG tablet Take 5 mg by mouth every Sunday.     . tamsulosin (FLOMAX) 0.4 MG CAPS capsule Take 0.4 mg by mouth daily.    Marland Kitchen CINNAMON PO Take 1 capsule by mouth daily. (Patient not taking: Reported on 09/13/2019)    . famotidine (PEPCID) 20 MG tablet Take 1 tablet (20 mg total) by mouth 2 (two) times daily. (Patient not taking: Reported on 09/13/2019) 30 tablet 0  . losartan (COZAAR) 25 MG tablet Take 25 mg by mouth daily. (Patient not taking: Reported on 09/13/2019)    . metFORMIN (GLUCOPHAGE-XR) 500 MG 24 hr tablet Take 500 mg by mouth daily before breakfast.  (Patient not taking: Reported on 09/13/2019)     No current facility-administered medications for this visit.    Review of Systems: GENERAL: negative for malaise, night sweats HEENT: No changes in hearing or vision, no nose bleeds or other nasal problems. NECK: Negative for lumps, goiter, pain and significant neck swelling RESPIRATORY: Negative for cough, wheezing CARDIOVASCULAR: Negative for chest pain, leg swelling, palpitations, orthopnea GI: SEE HPI MUSCULOSKELETAL: Negative for joint pain or swelling, back pain, and muscle pain. SKIN: Negative for lesions, rash PSYCH: Negative for sleep disturbance, mood disorder and recent psychosocial stressors. HEMATOLOGY Negative for prolonged bleeding, bruising easily, and swollen nodes. ENDOCRINE: Negative for cold or heat intolerance, polyuria, polydipsia and goiter. NEURO: negative for tremor, gait imbalance, syncope and  seizures. The remainder of the review of systems is noncontributory.   Physical Exam: BP 134/90 (BP Location: Right Arm, Patient Position: Sitting, Cuff Size: Normal)   Pulse 90   Temp 97.7 F (36.5 C) (Oral)   Ht 5\' 6"  (1.676 m)   Wt 176 lb 11.2 oz (80.2 kg)   BMI 28.52 kg/m  GENERAL: The patient is AO x3, in no acute distress. HEENT: Head is normocephalic and atraumatic. EOMI are intact. Mouth is well hydrated and without lesions. NECK: Supple. No masses LUNGS: Clear to auscultation. No presence of rhonchi/wheezing/rales. Adequate chest expansion HEART: RRR, normal s1 and s2. ABDOMEN: Soft, nontender, no guarding, no peritoneal signs, and nondistended. BS +. No masses. EXTREMITIES: Without any cyanosis, clubbing, rash, lesions or edema. NEUROLOGIC: AOx3, no focal motor deficit. SKIN: no jaundice, no rashes  Imaging/Labs: as above  I personally reviewed and interpreted the available labs, imaging and endoscopic files.  Impression and Plan: Zachary Craig is a 74 y.o. male with PMH DM, GERD and HTN, who presents for  follow up of upper abdominal discomfort and fullness sensation after eating food, and constipation.  The patient has presented constellation of symptoms that are not specific for single disorder, although possibility of generalized dysmotility gastrointestinal disorder remains in the differential.  He has had extensive  investigations that have rule out any organic causes for his dysmotility in the upper GI tract but given the new onset of fullness in his upper abdomen, will need to proceed with a gastric imaging study.  If this test is negative and he remains symptomatic, the patient will be referred for an esophageal manometry.  For now, he can advance his diet as tolerated which she understood.  Finally, the patient has presented some constipation that has not improved with fiber intake.  This could be in the spectrum of his generalized dysmotility, for which the patient  will be switched to [facility] on a daily basis.  - Schedule gastric emptying study - Advance diet as tolerated - Start taking Miralax 1 cap every day for one week. If bowel movements do not improve, increase to 1 cup every 12 hours. If after two weeks there is no improvement, increase to 1 cup every 8 hours -Stop Dulcolax -We will discuss checking for H. pylori eradication next appointment  All questions were answered.      Dolores Frame, MD Gastroenterology and Hepatology [facility]

## 2019-09-14 ENCOUNTER — Encounter (HOSPITAL_BASED_OUTPATIENT_CLINIC_OR_DEPARTMENT_OTHER)
Admission: RE | Admit: 2019-09-14 | Discharge: 2019-09-14 | Disposition: A | Discharge status: Home / Self Care | Payer: Medicare HMO | Source: Ambulatory Visit | Attending: Cardiology | Admitting: Cardiology

## 2019-09-14 ENCOUNTER — Encounter (HOSPITAL_COMMUNITY): Payer: Self-pay

## 2019-09-14 ENCOUNTER — Encounter (HOSPITAL_COMMUNITY)
Admission: RE | Admit: 2019-09-14 | Discharge: 2019-09-14 | Disposition: A | Discharge status: Home / Self Care | Payer: Medicare HMO | Source: Ambulatory Visit | Attending: Cardiology | Admitting: Cardiology

## 2019-09-14 DIAGNOSIS — I2510 Atherosclerotic heart disease of native coronary artery without angina pectoris: Secondary | ICD-10-CM | POA: Insufficient documentation

## 2019-09-14 LAB — NM MYOCAR MULTI W/SPECT W/WALL MOTION / EF
LV dias vol: 52 mL (ref 62–150)
LV sys vol: 23 mL
Peak HR: 113 {beats}/min
RATE: 0.31
Rest HR: 75 {beats}/min
SDS: 4
SRS: 4
SSS: 8
TID: 0.86

## 2019-09-14 MED ORDER — SODIUM CHLORIDE FLUSH 0.9 % IV SOLN
INTRAVENOUS | Status: AC
  Administered 2019-09-14: 10 mL via INTRAVENOUS
  Filled 2019-09-14: qty 10

## 2019-09-14 MED ORDER — REGADENOSON 0.4 MG/5ML IV SOLN
INTRAVENOUS | Status: AC
  Administered 2019-09-14: 0.4 mg via INTRAVENOUS
  Filled 2019-09-14: qty 5

## 2019-09-14 MED ORDER — TECHNETIUM TC 99M TETROFOSMIN IV KIT
10.0000 | PACK | Freq: Once | INTRAVENOUS | Status: AC | PRN
  Administered 2019-09-14: 9.5 via INTRAVENOUS

## 2019-09-14 MED ORDER — TECHNETIUM TC 99M TETROFOSMIN IV KIT
30.0000 | PACK | Freq: Once | INTRAVENOUS | Status: AC | PRN
  Administered 2019-09-14: 31.3 via INTRAVENOUS

## 2019-09-15 ENCOUNTER — Telehealth: Payer: Self-pay | Admitting: *Deleted

## 2019-09-15 NOTE — Telephone Encounter (Signed)
-----   Message from Jonelle Sidle, MD sent at 09/14/2019  3:23 PM EDT ----- Results reviewed.  Myoview was overall low risk.  Diaphragmatic attenuation versus possible mild inferior ischemia noted.  In the absence of obvious angina symptoms would recommend medical therapy and observation with known coronary artery calcification by CT imaging.  We should see him back in 1 year presuming no symptoms intervene.

## 2019-09-16 ENCOUNTER — Encounter: Payer: Self-pay | Admitting: *Deleted

## 2019-09-16 NOTE — Telephone Encounter (Signed)
Patient returned call. Please call [phone].

## 2019-09-16 NOTE — Telephone Encounter (Signed)
Pt voiced understanding

## 2019-09-20 ENCOUNTER — Encounter (INDEPENDENT_AMBULATORY_CARE_PROVIDER_SITE_OTHER): Payer: Self-pay | Admitting: *Deleted

## 2019-09-26 ENCOUNTER — Ambulatory Visit (INDEPENDENT_AMBULATORY_CARE_PROVIDER_SITE_OTHER): Payer: Medicare HMO | Admitting: Gastroenterology

## 2019-10-11 ENCOUNTER — Ambulatory Visit (INDEPENDENT_AMBULATORY_CARE_PROVIDER_SITE_OTHER): Payer: Medicare HMO | Admitting: Gastroenterology

## 2019-10-31 ENCOUNTER — Other Ambulatory Visit (INDEPENDENT_AMBULATORY_CARE_PROVIDER_SITE_OTHER): Payer: Self-pay | Admitting: Gastroenterology

## 2019-10-31 DIAGNOSIS — A048 Other specified bacterial intestinal infections: Secondary | ICD-10-CM

## 2020-04-02 ENCOUNTER — Telehealth (INDEPENDENT_AMBULATORY_CARE_PROVIDER_SITE_OTHER): Payer: Self-pay

## 2020-04-02 ENCOUNTER — Other Ambulatory Visit (INDEPENDENT_AMBULATORY_CARE_PROVIDER_SITE_OTHER): Payer: Self-pay

## 2020-04-02 ENCOUNTER — Encounter (INDEPENDENT_AMBULATORY_CARE_PROVIDER_SITE_OTHER): Payer: Self-pay | Admitting: Gastroenterology

## 2020-04-02 ENCOUNTER — Encounter (INDEPENDENT_AMBULATORY_CARE_PROVIDER_SITE_OTHER): Payer: Self-pay

## 2020-04-02 ENCOUNTER — Other Ambulatory Visit: Payer: Self-pay

## 2020-04-02 ENCOUNTER — Ambulatory Visit (INDEPENDENT_AMBULATORY_CARE_PROVIDER_SITE_OTHER): Payer: Medicare HMO | Admitting: Gastroenterology

## 2020-04-02 VITALS — BP 123/76 | HR 97 | Temp 97.0°F | Ht 66.0 in | Wt 190.0 lb

## 2020-04-02 DIAGNOSIS — Z1211 Encounter for screening for malignant neoplasm of colon: Secondary | ICD-10-CM

## 2020-04-02 DIAGNOSIS — R1110 Vomiting, unspecified: Secondary | ICD-10-CM | POA: Diagnosis not present

## 2020-04-02 DIAGNOSIS — K219 Gastro-esophageal reflux disease without esophagitis: Secondary | ICD-10-CM

## 2020-04-02 MED ORDER — PEG 3350-KCL-NA BICARB-NACL 420 G PO SOLR: 4000.0000 mL | ORAL | 0 refills | Status: DC

## 2020-04-02 NOTE — Telephone Encounter (Signed)
LeighAnn Lovelace, CMA  

## 2020-04-02 NOTE — Progress Notes (Signed)
[name], M.D. Gastroenterology & Hepatology [facility] [address] [place], [place] [address]  Primary Care Physician: [name], MD [address] [place] [address]  I will communicate my assessment and recommendations to the referring MD via EMR.  Problems: 1. Retrosternal discomfort 2. History of H. pylori status post quadruple regimen 3. History of exposure to Triazicide 4. GERD  History of Present Illness: Zachary Craig is a 75 y.o. male with PMH DM, GERD and HTN, who presents for evaluation of cough and retrosternal discomfort.  The patient was last seen on 09/13/2019. At that time, the patient was ordered to have a gastric empty study, his diet was advanced and he was counseled to start taking MiraLAX to improve his bowel movements.  However, he did not perform the gastric emptying study as he felt improvement of his symptoms of dysphagia and recurrent vomiting.  He reports that ever since he had his episode of new onset dysphagia during the summer 2021, he has persisted with some retrosternal discomfort that is intermittent.  It happens more often when he swallows any food but he also reports having the discomfort even if he is not eating.  States that he feels some "regurgitation of contents to his mouth" denies any dysphagia but states that sometimes the regurgitation causes some dry cough spells and sneezing multiple episodes.  He does not know how to describe this but reports that he has been affecting him significantly. He is concerned as he does not know if this could mean he has a cancer in his chest.  He currently takes Prilosec 20 mg as needed when he has burning sensation in the epigastric area but does not take it often.  The patient denies having any nausea, vomiting, fever, chills, hematochezia, melena, hematemesis, dysphagia or odynophagia, abdominal distention, abdominal pain, diarrhea, jaundice, pruritus  or weight loss.  Last EGD:08/12/2019, empiric dilation was performed with a Savary dilator up to 18 mm, no hematin was seen upon reinspection.  There was a single erosion of 3 mm in the gastric body (biopsies showed H. 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 Last Colonoscopy: 10  Years ago - normal  Past Medical History: Past Medical History:  Diagnosis Date  . BPH (benign prostatic hyperplasia)   . Diverticulosis   . Essential hypertension   . GERD (gastroesophageal reflux disease)   . History of URI (upper respiratory infection)   . Mixed hyperlipidemia   . Type 2 diabetes mellitus (HCC)     Past Surgical History: Past Surgical History:  Procedure Laterality Date  . BIOPSY  08/12/2019   Procedure: BIOPSY;  Surgeon: Dolores Frame, MD;  [facility];  Service: Gastroenterology;;  gastric   . ESOPHAGEAL DILATION N/A 08/12/2019   Procedure: ESOPHAGEAL DILATION;  Surgeon: Dolores Frame, MD;  [facility];  Service: Gastroenterology;  Laterality: N/A;  . ESOPHAGOGASTRODUODENOSCOPY (EGD) WITH PROPOFOL N/A 08/12/2019   Procedure: ESOPHAGOGASTRODUODENOSCOPY (EGD) WITH PROPOFOL;  Surgeon: Dolores Frame, MD;  [facility];  Service: Gastroenterology;  Laterality: N/A;  245  . HIP ARTHROSCOPY Left     Family History: Family History  Problem Relation Age of Onset  . Diabetes Mellitus II Mother   . Heart attack Father     Social History: Social History   Tobacco Use  Smoking Status Never Smoker  Smokeless Tobacco Never Used   Social History   Substance and Sexual Activity  Alcohol Use Never   Social History   Substance and Sexual Activity  Drug Use Never    Allergies: Allergies  Allergen Reactions  . Sulfa Antibiotics Itching    Medications: Current Outpatient Medications  Medication Sig Dispense Refill  . CINNAMON PO Take 1 capsule by mouth daily.    . losartan (COZAAR)  25 MG tablet Take 25 mg by mouth daily.    . metFORMIN (GLUCOPHAGE-XR) 500 MG 24 hr tablet Take 500 mg by mouth daily before breakfast.    . omeprazole (PRILOSEC) 40 MG capsule TAKE 1 CAPSULE BY MOUTH IN THE MORNING AND AT BEDTIME FOR 14 DAYS 28 capsule 0  . Probiotic Product (DIGESTIVE ADV+BOWEL SUPPORT PO) Take 2 capsules by mouth daily.     . rosuvastatin (CRESTOR) 5 MG tablet Take 5 mg by mouth every Sunday.     . tamsulosin (FLOMAX) 0.4 MG CAPS capsule Take 0.4 mg by mouth daily.    . polyethylene glycol-electrolytes (TRILYTE) 420 g solution Take 4,000 mLs by mouth as directed. 4000 mL 0   No current facility-administered medications for this visit.    Review of Systems: GENERAL: negative for malaise, night sweats HEENT: No changes in hearing or vision, no nose bleeds or other nasal problems. NECK: Negative for lumps, goiter, pain and significant neck swelling RESPIRATORY: Negative for cough, wheezing CARDIOVASCULAR: Negative for chest pain, leg swelling, palpitations, orthopnea GI: SEE HPI MUSCULOSKELETAL: Negative for joint pain or swelling, back pain, and muscle pain. SKIN: Negative for lesions, rash PSYCH: Negative for sleep disturbance, mood disorder and recent psychosocial stressors. HEMATOLOGY Negative for prolonged bleeding, bruising easily, and swollen nodes. ENDOCRINE: Negative for cold or heat intolerance, polyuria, polydipsia and goiter. NEURO: negative for tremor, gait imbalance, syncope and seizures. The remainder of the review of systems is noncontributory.   Physical Exam: BP 123/76 (BP Location: Left Arm, Patient Position: Sitting, Cuff Size: Large)   Pulse 97   Temp (!) 97 F (36.1 C) (Oral)   Ht 5' 6" (1.676 m)   Wt 190 lb (86.2 kg)   BMI 30.67 kg/m  GENERAL: The patient is AO x3, in no acute distress. HEENT: Head is normocephalic and atraumatic. EOMI are intact. Mouth is well hydrated and without lesions. NECK: Supple. No masses LUNGS: Clear to  auscultation. No presence of rhonchi/wheezing/rales. Adequate chest expansion HEART: RRR, normal s1 and s2. ABDOMEN: Soft, nontender, no guarding, no peritoneal signs, and nondistended. BS +. No masses. EXTREMITIES: Without any cyanosis, clubbing, rash, lesions or edema. NEUROLOGIC: AOx3, no focal motor deficit. SKIN: no jaundice, no rashes  Imaging/Labs: as above  I personally reviewed and interpreted the available labs, imaging and endoscopic files.  Impression and Plan: Zachary Craig is a 75 y.o. male with PMH DM, GERD and HTN, who presents for evaluation of cough and retrosternal discomfort.  Patient has had multiple investigations in the last few months that have not shown any anatomic or organic ulceration explain his symptoms.  In fact, his initial symptoms have improved, but he is now presenting with chronic retrosternal discomfort, regurgitation and cough with frequent sneezing.  I explained to him that this could be related to bowel hypersensitivity or airway hyperreactivity, for which it would be important to be evaluated by pulmonologist or allergy doctor.  However, I explained to him that some of the symptoms could be related to GERD, for which he will need to take his PPI compliantly every day and we will assess his symptom improvement in the future.  I   also talked him about the possibility of performing esophageal manometry and pH impedance testing if this fails to improve his symptoms but the patient reports that he would rather evaluate his symptom response with the PPI before deciding to perform this test, which I think is appropriate.  Finally, he is due for colorectal cancer screening, for which I will order a colonoscopy.  -Schedule colonoscopy -Continue omeprazole 40 mg qday  All questions were answered.      Dolores Frame, MD Gastroenterology and Hepatology [facility]

## 2020-04-02 NOTE — H&P (View-Only) (Signed)
[name], M.D. Gastroenterology & Hepatology [facility] [address] [place], [place] [address]  Primary Care Physician: [name], MD [address] [place] [address]  I will communicate my assessment and recommendations to the referring MD via EMR.  Problems: 1. Retrosternal discomfort 2. History of H. pylori status post quadruple regimen 3. History of exposure to Triazicide 4. GERD  History of Present Illness: Zachary Craig is a 75 y.o. male with PMH DM, GERD and HTN, who presents for evaluation of cough and retrosternal discomfort.  The patient was last seen on 09/13/2019. At that time, the patient was ordered to have a gastric empty study, his diet was advanced and he was counseled to start taking MiraLAX to improve his bowel movements.  However, he did not perform the gastric emptying study as he felt improvement of his symptoms of dysphagia and recurrent vomiting.  He reports that ever since he had his episode of new onset dysphagia during the summer 2021, he has persisted with some retrosternal discomfort that is intermittent.  It happens more often when he swallows any food but he also reports having the discomfort even if he is not eating.  States that he feels some "regurgitation of contents to his mouth" denies any dysphagia but states that sometimes the regurgitation causes some dry cough spells and sneezing multiple episodes.  He does not know how to describe this but reports that he has been affecting him significantly. He is concerned as he does not know if this could mean he has a cancer in his chest.  He currently takes Prilosec 20 mg as needed when he has burning sensation in the epigastric area but does not take it often.  The patient denies having any nausea, vomiting, fever, chills, hematochezia, melena, hematemesis, dysphagia or odynophagia, abdominal distention, abdominal pain, diarrhea, jaundice, pruritus  or weight loss.  Last EGD:08/12/2019, empiric dilation was performed with a Savary dilator up to 18 mm, no hematin was seen upon reinspection.  There was a single erosion of 3 mm in the gastric body (biopsies showed H. 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 Last Colonoscopy: 10  Years ago - normal  Past Medical History: Past Medical History:  Diagnosis Date  . BPH (benign prostatic hyperplasia)   . Diverticulosis   . Essential hypertension   . GERD (gastroesophageal reflux disease)   . History of URI (upper respiratory infection)   . Mixed hyperlipidemia   . Type 2 diabetes mellitus (HCC)     Past Surgical History: Past Surgical History:  Procedure Laterality Date  . BIOPSY  08/12/2019   Procedure: BIOPSY;  Surgeon: Dolores Frame, MD;  Location: [facility];  Service: Gastroenterology;;  gastric   . ESOPHAGEAL DILATION N/A 08/12/2019   Procedure: ESOPHAGEAL DILATION;  Surgeon: Dolores Frame, MD;  Location: [facility];  Service: Gastroenterology;  Laterality: N/A;  . ESOPHAGOGASTRODUODENOSCOPY (EGD) WITH PROPOFOL N/A 08/12/2019   Procedure: ESOPHAGOGASTRODUODENOSCOPY (EGD) WITH PROPOFOL;  Surgeon: Dolores Frame, MD;  Location: [facility];  Service: Gastroenterology;  Laterality: N/A;  245  . HIP ARTHROSCOPY Left     Family History: Family History  Problem Relation Age of Onset  . Diabetes Mellitus II Mother   . Heart attack Father     Social History: Social History   Tobacco Use  Smoking Status Never Smoker  Smokeless Tobacco Never Used   Social History   Substance and Sexual Activity  Alcohol Use Never   Social History   Substance and Sexual Activity  Drug Use Never    Allergies: Allergies  Allergen Reactions  . Sulfa Antibiotics Itching    Medications: Current Outpatient Medications  Medication Sig Dispense Refill  . CINNAMON PO Take 1 capsule by mouth daily.    Marland Kitchen losartan (COZAAR)  25 MG tablet Take 25 mg by mouth daily.    . metFORMIN (GLUCOPHAGE-XR) 500 MG 24 hr tablet Take 500 mg by mouth daily before breakfast.    . omeprazole (PRILOSEC) 40 MG capsule TAKE 1 CAPSULE BY MOUTH IN THE MORNING AND AT BEDTIME FOR 14 DAYS 28 capsule 0  . Probiotic Product (DIGESTIVE ADV+BOWEL SUPPORT PO) Take 2 capsules by mouth daily.     . rosuvastatin (CRESTOR) 5 MG tablet Take 5 mg by mouth every Sunday.     . tamsulosin (FLOMAX) 0.4 MG CAPS capsule Take 0.4 mg by mouth daily.    . polyethylene glycol-electrolytes (TRILYTE) 420 g solution Take 4,000 mLs by mouth as directed. 4000 mL 0   No current facility-administered medications for this visit.    Review of Systems: GENERAL: negative for malaise, night sweats HEENT: No changes in hearing or vision, no nose bleeds or other nasal problems. NECK: Negative for lumps, goiter, pain and significant neck swelling RESPIRATORY: Negative for cough, wheezing CARDIOVASCULAR: Negative for chest pain, leg swelling, palpitations, orthopnea GI: SEE HPI MUSCULOSKELETAL: Negative for joint pain or swelling, back pain, and muscle pain. SKIN: Negative for lesions, rash PSYCH: Negative for sleep disturbance, mood disorder and recent psychosocial stressors. HEMATOLOGY Negative for prolonged bleeding, bruising easily, and swollen nodes. ENDOCRINE: Negative for cold or heat intolerance, polyuria, polydipsia and goiter. NEURO: negative for tremor, gait imbalance, syncope and seizures. The remainder of the review of systems is noncontributory.   Physical Exam: BP 123/76 (BP Location: Left Arm, Patient Position: Sitting, Cuff Size: Large)   Pulse 97   Temp (!) 97 F (36.1 C) (Oral)   Ht 5\' 6"  (1.676 m)   Wt 190 lb (86.2 kg)   BMI 30.67 kg/m  GENERAL: The patient is AO x3, in no acute distress. HEENT: Head is normocephalic and atraumatic. EOMI are intact. Mouth is well hydrated and without lesions. NECK: Supple. No masses LUNGS: Clear to  auscultation. No presence of rhonchi/wheezing/rales. Adequate chest expansion HEART: RRR, normal s1 and s2. ABDOMEN: Soft, nontender, no guarding, no peritoneal signs, and nondistended. BS +. No masses. EXTREMITIES: Without any cyanosis, clubbing, rash, lesions or edema. NEUROLOGIC: AOx3, no focal motor deficit. SKIN: no jaundice, no rashes  Imaging/Labs: as above  I personally reviewed and interpreted the available labs, imaging and endoscopic files.  Impression and Plan: Zachary Craig is a 75 y.o. male with PMH DM, GERD and HTN, who presents for evaluation of cough and retrosternal discomfort.  Patient has had multiple investigations in the last few months that have not shown any anatomic or organic ulceration explain his symptoms.  In fact, his initial symptoms have improved, but he is now presenting with chronic retrosternal discomfort, regurgitation and cough with frequent sneezing.  I explained to him that this could be related to bowel hypersensitivity or airway hyperreactivity, for which it would be important to be evaluated by pulmonologist or allergy doctor.  However, I explained to him that some of the symptoms could be related to GERD, for which he will need to take his PPI compliantly every day and we will assess his symptom improvement in the future.  I  also talked him about the possibility of performing esophageal manometry and pH impedance testing if this fails to improve his symptoms but the patient reports that he would rather evaluate his symptom response with the PPI before deciding to perform this test, which I think is appropriate.  Finally, he is due for colorectal cancer screening, for which I will order a colonoscopy.  -Schedule colonoscopy -Continue omeprazole 40 mg qday  All questions were answered.      Dolores Frame, MD Gastroenterology and Hepatology [facility]

## 2020-04-02 NOTE — Patient Instructions (Addendum)
Schedule colonoscopy Continue omeprazole 40 mg qday

## 2020-04-16 ENCOUNTER — Other Ambulatory Visit: Payer: Self-pay

## 2020-04-16 ENCOUNTER — Other Ambulatory Visit (HOSPITAL_COMMUNITY)
Admission: RE | Admit: 2020-04-16 | Discharge: 2020-04-16 | Disposition: A | Discharge status: Home / Self Care | Payer: Medicare HMO | Source: Ambulatory Visit | Attending: Gastroenterology | Admitting: Gastroenterology

## 2020-04-16 ENCOUNTER — Encounter (HOSPITAL_COMMUNITY): Payer: Self-pay | Admitting: Gastroenterology

## 2020-04-16 DIAGNOSIS — Z01812 Encounter for preprocedural laboratory examination: Secondary | ICD-10-CM | POA: Insufficient documentation

## 2020-04-16 DIAGNOSIS — Z20822 Contact with and (suspected) exposure to covid-19: Secondary | ICD-10-CM | POA: Diagnosis not present

## 2020-04-17 LAB — SARS CORONAVIRUS 2 (TAT 6-24 HRS): SARS Coronavirus 2: NEGATIVE

## 2020-04-18 ENCOUNTER — Ambulatory Visit (HOSPITAL_COMMUNITY)
Admission: RE | Admit: 2020-04-18 | Discharge: 2020-04-18 | Disposition: A | Discharge status: Home / Self Care | Payer: Medicare HMO | Source: Ambulatory Visit | Attending: Gastroenterology | Admitting: Gastroenterology

## 2020-04-18 ENCOUNTER — Ambulatory Visit (HOSPITAL_COMMUNITY): Payer: Medicare HMO | Admitting: Anesthesiology

## 2020-04-18 ENCOUNTER — Encounter (HOSPITAL_COMMUNITY)
Admission: RE | Disposition: A | Discharge status: Home / Self Care | Payer: Self-pay | Source: Ambulatory Visit | Attending: Gastroenterology

## 2020-04-18 ENCOUNTER — Encounter (HOSPITAL_COMMUNITY): Payer: Self-pay | Admitting: Gastroenterology

## 2020-04-18 ENCOUNTER — Other Ambulatory Visit: Payer: Self-pay

## 2020-04-18 ENCOUNTER — Encounter (INDEPENDENT_AMBULATORY_CARE_PROVIDER_SITE_OTHER): Payer: Self-pay | Admitting: *Deleted

## 2020-04-18 DIAGNOSIS — K5730 Diverticulosis of large intestine without perforation or abscess without bleeding: Secondary | ICD-10-CM

## 2020-04-18 DIAGNOSIS — Z8249 Family history of ischemic heart disease and other diseases of the circulatory system: Secondary | ICD-10-CM | POA: Insufficient documentation

## 2020-04-18 DIAGNOSIS — R1310 Dysphagia, unspecified: Secondary | ICD-10-CM | POA: Insufficient documentation

## 2020-04-18 DIAGNOSIS — E119 Type 2 diabetes mellitus without complications: Secondary | ICD-10-CM | POA: Insufficient documentation

## 2020-04-18 DIAGNOSIS — Z7984 Long term (current) use of oral hypoglycemic drugs: Secondary | ICD-10-CM | POA: Insufficient documentation

## 2020-04-18 DIAGNOSIS — Z1211 Encounter for screening for malignant neoplasm of colon: Secondary | ICD-10-CM

## 2020-04-18 DIAGNOSIS — I10 Essential (primary) hypertension: Secondary | ICD-10-CM | POA: Insufficient documentation

## 2020-04-18 DIAGNOSIS — Z79899 Other long term (current) drug therapy: Secondary | ICD-10-CM | POA: Diagnosis not present

## 2020-04-18 DIAGNOSIS — K219 Gastro-esophageal reflux disease without esophagitis: Secondary | ICD-10-CM | POA: Insufficient documentation

## 2020-04-18 DIAGNOSIS — Z882 Allergy status to sulfonamides status: Secondary | ICD-10-CM | POA: Insufficient documentation

## 2020-04-18 DIAGNOSIS — Z833 Family history of diabetes mellitus: Secondary | ICD-10-CM | POA: Diagnosis not present

## 2020-04-18 HISTORY — PX: COLONOSCOPY WITH PROPOFOL: SHX5780

## 2020-04-18 LAB — GLUCOSE, CAPILLARY: Glucose-Capillary: 134 mg/dL — ABNORMAL HIGH (ref 70–99)

## 2020-04-18 LAB — HM COLONOSCOPY

## 2020-04-18 SURGERY — COLONOSCOPY WITH PROPOFOL
Anesthesia: General

## 2020-04-18 MED ORDER — PROPOFOL 500 MG/50ML IV EMUL
INTRAVENOUS | Status: DC | PRN
  Administered 2020-04-18: 125 ug/kg/min via INTRAVENOUS

## 2020-04-18 MED ORDER — LACTATED RINGERS IV SOLN
INTRAVENOUS | Status: DC
  Administered 2020-04-18: 1000 mL via INTRAVENOUS

## 2020-04-18 MED ORDER — STERILE WATER FOR IRRIGATION IR SOLN: Status: DC | PRN

## 2020-04-18 MED ORDER — PROPOFOL 10 MG/ML IV BOLUS
INTRAVENOUS | Status: DC | PRN
  Administered 2020-04-18: 100 mg via INTRAVENOUS

## 2020-04-18 MED ORDER — STERILE WATER FOR IRRIGATION IR SOLN
Status: DC | PRN
  Administered 2020-04-18: 100 mL

## 2020-04-18 MED ORDER — PHENYLEPHRINE HCL (PRESSORS) 10 MG/ML IV SOLN
INTRAVENOUS | Status: DC | PRN
  Administered 2020-04-18 (×5): 80 ug via INTRAVENOUS

## 2020-04-18 NOTE — Anesthesia Preprocedure Evaluation (Signed)
Anesthesia Evaluation  Patient identified by MRN, date of birth, ID band Patient awake    Reviewed: Allergy & Precautions, H&P , NPO status , Patient's Chart, lab work & pertinent test results, reviewed documented beta blocker date and time   Airway Mallampati: II  TM Distance: >3 FB Neck ROM: full    Dental no notable dental hx.    Pulmonary neg pulmonary ROS,    Pulmonary exam normal breath sounds clear to auscultation       Cardiovascular Exercise Tolerance: Good hypertension, negative cardio ROS   Rhythm:regular Rate:Normal     Neuro/Psych PSYCHIATRIC DISORDERS negative neurological ROS     GI/Hepatic Neg liver ROS, GERD  Medicated,  Endo/Other  negative endocrine ROSdiabetes, Type 2  Renal/GU negative Renal ROS  negative genitourinary   Musculoskeletal   Abdominal   Peds  Hematology negative hematology ROS (+)   Anesthesia Other Findings   Reproductive/Obstetrics negative OB ROS                             Anesthesia Physical Anesthesia Plan  ASA: II  Anesthesia Plan: General   Post-op Pain Management:    Induction:   PONV Risk Score and Plan: Propofol infusion  Airway Management Planned:   Additional Equipment:   Intra-op Plan:   Post-operative Plan:   Informed Consent: I have reviewed the patients History and Physical, chart, labs and discussed the procedure including the risks, benefits and alternatives for the proposed anesthesia with the patient or authorized representative who has indicated his/her understanding and acceptance.     Dental Advisory Given  Plan Discussed with: CRNA  Anesthesia Plan Comments:         Anesthesia Quick Evaluation

## 2020-04-18 NOTE — Anesthesia Postprocedure Evaluation (Signed)
Anesthesia Post Note  Patient: KAIDEN PECH  Procedure(s) Performed: COLONOSCOPY WITH PROPOFOL (N/A )  Patient location during evaluation: Phase II Anesthesia Type: General Level of consciousness: awake and alert Pain management: satisfactory to patient Vital Signs Assessment: post-procedure vital signs reviewed and stable Respiratory status: spontaneous breathing and respiratory function stable Cardiovascular status: stable Postop Assessment: no apparent nausea or vomiting Anesthetic complications: no   No complications documented.   Last Vitals:  Vitals:   04/18/20 0726 04/18/20 0910  BP: 129/80   Pulse: 80 66  Resp: 11 10  Temp: 36.4 C 36.6 C  SpO2: 99% 97%    Last Pain:  Vitals:   04/18/20 0910  TempSrc: Oral  PainSc:                  Lorin Glass

## 2020-04-18 NOTE — Transfer of Care (Signed)
Immediate Anesthesia Transfer of Care Note  Patient: Zachary Craig  Procedure(s) Performed: COLONOSCOPY WITH PROPOFOL (N/A )  Patient Location: PACU  Anesthesia Type:General  Level of Consciousness: drowsy  Airway & Oxygen Therapy: Patient Spontanous Breathing  Post-op Assessment: Report given to RN and Post -op Vital signs reviewed and stable  Post vital signs: Reviewed and stable  Last Vitals:  Vitals Value Taken Time  BP    Temp 36.6 C 04/18/20 0910  Pulse 66 04/18/20 0910  Resp 10 04/18/20 0910  SpO2 97 % 04/18/20 0910    Last Pain:  Vitals:   04/18/20 0910  TempSrc: Oral  PainSc:       Patients Stated Pain Goal: 6 (04/18/20 0726)  Complications: No complications documented.

## 2020-04-18 NOTE — Discharge Instructions (Signed)
Diverticulosis  Diverticulosis is a condition that develops when small pouches (diverticula) form in the wall of the large intestine (colon). The colon is where water is absorbed and stool (feces) is formed. The pouches form when the inside layer of the colon pushes through weak spots in the outer layers of the colon. You may have a few pouches or many of them. The pouches usually do not cause problems unless they become inflamed or infected. When this happens, the condition is called diverticulitis. What are the causes? The cause of this condition is not known. What increases the risk? The following factors may make you more likely to develop this condition:  Being older than age 60. Your risk for this condition increases with age. Diverticulosis is rare among people younger than age 30. By age 80, many people have it.  Eating a low-fiber diet.  Having frequent constipation.  Being overweight.  Not getting enough exercise.  Smoking.  Taking over-the-counter pain medicines, like aspirin and ibuprofen.  Having a family history of diverticulosis. What are the signs or symptoms? In most people, there are no symptoms of this condition. If you do have symptoms, they may include:  Bloating.  Cramps in the abdomen.  Constipation or diarrhea.  Pain in the lower left side of the abdomen. How is this diagnosed? Because diverticulosis usually has no symptoms, it is most often diagnosed during an exam for other colon problems. The condition may be diagnosed by:  Using a flexible scope to examine the colon (colonoscopy).  Taking an X-ray of the colon after dye has been put into the colon (barium enema).  Having a CT scan. How is this treated? You may not need treatment for this condition. Your health care provider may recommend treatment to prevent problems. You may need treatment if you have symptoms or if you previously had diverticulitis. Treatment may include:  Eating a high-fiber  diet.  Taking a fiber supplement.  Taking a live bacteria supplement (probiotic).  Taking medicine to relax your colon.   Follow these instructions at home: Medicines  Take over-the-counter and prescription medicines only as told by your health care provider.  If told by your health care provider, take a fiber supplement or probiotic. Constipation prevention Your condition may cause constipation. To prevent or treat constipation, you may need to:  Drink enough fluid to keep your urine pale yellow.  Take over-the-counter or prescription medicines.  Eat foods that are high in fiber, such as beans, whole grains, and fresh fruits and vegetables.  Limit foods that are high in fat and processed sugars, such as fried or sweet foods.   General instructions  Try not to strain when you have a bowel movement.  Keep all follow-up visits as told by your health care provider. This is important. Contact a health care provider if you:  Have pain in your abdomen.  Have bloating.  Have cramps.  Have not had a bowel movement in 3 days. Get help right away if:  Your pain gets worse.  Your bloating becomes very bad.  You have a fever or chills, and your symptoms suddenly get worse.  You vomit.  You have bowel movements that are bloody or black.  You have bleeding from your rectum. Summary  Diverticulosis is a condition that develops when small pouches (diverticula) form in the wall of the large intestine (colon).  You may have a few pouches or many of them.  This condition is most often diagnosed during an exam   for other colon problems.  Treatment may include increasing the fiber in your diet, taking supplements, or taking medicines. This information is not intended to replace advice given to you by your health care provider. Make sure you discuss any questions you have with your health care provider. Document Revised: 07/29/2018 Document Reviewed: 07/29/2018 Elsevier Patient  Education  2021 [employer]. Colonoscopy, Adult, Care After This sheet gives you information about how to care for yourself after your procedure. Your health care provider may also give you more specific instructions. If you have problems or questions, contact your health care provider. What can I expect after the procedure? After the procedure, it is common to have:  A small amount of blood in your stool for 24 hours after the procedure.  Some gas.  Mild cramping or bloating of your abdomen. Follow these instructions at home: Eating and drinking  Drink enough fluid to keep your urine pale yellow.  Follow instructions from your health care provider about eating or drinking restrictions.  Resume your normal diet as instructed by your health care provider. Avoid heavy or fried foods that are hard to digest.   Activity  Rest as told by your health care provider.  Avoid sitting for a long time without moving. Get up to take short walks every 1-2 hours. This is important to improve blood flow and breathing. Ask for help if you feel weak or unsteady.  Return to your normal activities as told by your health care provider. Ask your health care provider what activities are safe for you. Managing cramping and bloating  Try walking around when you have cramps or feel bloated.  Apply heat to your abdomen as told by your health care provider. Use the heat source that your health care provider recommends, such as a moist heat pack or a heating pad. ? Place a towel between your skin and the heat source. ? Leave the heat on for 20-30 minutes. ? Remove the heat if your skin turns bright red. This is especially important if you are unable to feel pain, heat, or cold. You may have a greater risk of getting burned.   General instructions  If you were given a sedative during the procedure, it can affect you for several hours. Do not drive or operate machinery until your health care provider says  that it is safe.  For the first 24 hours after the procedure: ? Do not sign important documents. ? Do not drink alcohol. ? Do your regular daily activities at a slower pace than normal. ? Eat soft foods that are easy to digest.  Take over-the-counter and prescription medicines only as told by your health care provider.  Keep all follow-up visits as told by your health care provider. This is important. Contact a health care provider if:  You have blood in your stool 2-3 days after the procedure. Get help right away if you have:  More than a small spotting of blood in your stool.  Large blood clots in your stool.  Swelling of your abdomen.  Nausea or vomiting.  A fever.  Increasing pain in your abdomen that is not relieved with medicine. Summary  After the procedure, it is common to have a small amount of blood in your stool. You may also have mild cramping and bloating of your abdomen.  If you were given a sedative during the procedure, it can affect you for several hours. Do not drive or operate machinery until your health care provider  says that it is safe.  Get help right away if you have a lot of blood in your stool, nausea or vomiting, a fever, or increased pain in your abdomen. This information is not intended to replace advice given to you by your health care provider. Make sure you discuss any questions you have with your health care provider. Document Revised: 12/24/2018 Document Reviewed: 07/26/2018 Elsevier Patient Education  2021 [employer] You are being discharged to home.  Resume your previous diet.  Your physician has recommended a repeat colonoscopy in 10 years for screening purposes if medically fit and with acceptable health.

## 2020-04-18 NOTE — Interval H&P Note (Signed)
History and Physical Interval Note:  04/18/2020 7:41 AM  Zachary Craig is a 75 y.o. male with PMH DM, GERD and HTN, who presents for screening colonoscopy.  The patient had his last colonoscopy 10 years ago which he reports was normal.   The patient denies having any complaints such as melena, hematochezia, abdominal pain or distention, change in her bowel movement consistency or frequency, no changes in her weight recently.  No family history of colorectal cancer.  BP 129/80   Pulse 80   Temp 97.6 F (36.4 C) (Oral)   Resp 11   Ht 5\' 6"  (1.676 m)   Wt 86.2 kg   SpO2 99%   BMI 30.67 kg/m  GENERAL: The patient is AO x3, in no acute distress. HEENT: Head is normocephalic and atraumatic. EOMI are intact. Mouth is well hydrated and without lesions. NECK: Supple. No masses LUNGS: Clear to auscultation. No presence of rhonchi/wheezing/rales. Adequate chest expansion HEART: RRR, normal s1 and s2. ABDOMEN: Soft, nontender, no guarding, no peritoneal signs, and nondistended. BS +. No masses. EXTREMITIES: Without any cyanosis, clubbing, rash, lesions or edema. NEUROLOGIC: AOx3, no focal motor deficit. SKIN: no jaundice, no rashes  Zachary Craig  has presented today for surgery, with the diagnosis of Screening colonoscopy.  The various methods of treatment have been discussed with the patient and family. 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  The patient's history has been reviewed, patient examined, no change in status, stable for surgery.  I have reviewed the patient's chart and labs.  Questions were answered to the patient's satisfaction.     Teodoro Spray Mayorga

## 2020-04-18 NOTE — Op Note (Signed)
[facility] Patient Name: Zachary Craig Procedure Date: 04/18/2020 8:31 AM MRN: [id number] Date of Birth: 17-May-1945 Attending MD: [name] ,  CSN: [id number] Age: 75 Admit Type: Outpatient Procedure:                Colonoscopy Indications:              Screening for colorectal malignant neoplasm Providers:                [name], Sheran Fava, Burke Keels, Technician Referring MD:              Medicines:                Monitored Anesthesia Care Complications:            No immediate complications. Estimated Blood Loss:     Estimated blood loss: none. Procedure:                Pre-Anesthesia Assessment:                           - Prior to the procedure, a History and Physical                            was performed, and patient medications, allergies                            and sensitivities were reviewed. The patient's                            tolerance of previous anesthesia was reviewed.                           - The risks and benefits of the procedure and the                            sedation options and risks were discussed with the                            patient. All questions were answered and informed                            consent was obtained.                           - ASA Grade Assessment: II - A patient with mild                            systemic disease.                           After obtaining informed consent, the colonoscope                            was passed under direct vision. Throughout the  procedure, the patient's blood pressure, pulse, and                            oxygen saturations were monitored continuously. The                            [id number]-HQ190L ([id number]) scope was introduced through                            the anus and advanced to the the cecum, identified                            by appendiceal orifice and ileocecal valve. The                             colonoscopy was performed without difficulty. The                            patient tolerated the procedure well. The quality                            of the bowel preparation was excellent. Scope                            withdrawal time was 11 minutes. Scope In: 8:43:25 AM Scope Out: 9:05:15 AM Scope Withdrawal Time: 0 hours 15 minutes 34 seconds  Total Procedure Duration: 0 hours 21 minutes 50 seconds  Findings:      The perianal and digital rectal examinations were normal.      Multiple small and large-mouthed diverticula were found in the sigmoid       colon and descending colon.      The retroflexed view of the distal rectum and anal verge was normal and       showed no anal or rectal abnormalities. Impression:               - Diverticulosis in the sigmoid colon and in the                            descending colon.                           - The distal rectum and anal verge are normal on                            retroflexion view.                           - No specimens collected. Moderate Sedation:      Per Anesthesia Care Recommendation:           - Discharge patient to home (ambulatory).                           - Resume previous diet.                           -  Repeat colonoscopy in 10 years for screening                            purposes if medically fit and with acceptable                            health. Procedure Code(s):        --- Professional ---                           Q2297, Colorectal cancer screening; colonoscopy on                            individual not meeting criteria for high risk Diagnosis Code(s):        --- Professional ---                           Z12.11, Encounter for screening for malignant                            neoplasm of colon                           K57.30, Diverticulosis of large intestine without                            perforation or abscess without bleeding CPT copyright 2019 American Medical  Association. All rights reserved. The codes documented in this report are preliminary and upon coder review may  be revised to meet current compliance requirements. [name], MD [name],  04/18/2020 9:09:49 AM This report has been signed electronically. Number of Addenda: 0

## 2020-04-23 ENCOUNTER — Encounter (HOSPITAL_COMMUNITY): Payer: Self-pay | Admitting: Gastroenterology

## 2022-02-20 ENCOUNTER — Ambulatory Visit: Payer: Medicare HMO | Attending: Cardiology | Admitting: Cardiology

## 2022-02-20 ENCOUNTER — Encounter: Payer: Self-pay | Admitting: Cardiology

## 2022-02-20 VITALS — BP 130/80 | HR 65 | Ht 66.0 in | Wt 192.6 lb

## 2022-02-20 DIAGNOSIS — I10 Essential (primary) hypertension: Secondary | ICD-10-CM | POA: Diagnosis not present

## 2022-02-20 DIAGNOSIS — I2510 Atherosclerotic heart disease of native coronary artery without angina pectoris: Secondary | ICD-10-CM

## 2022-02-20 NOTE — Progress Notes (Signed)
Cardiology Office Note  Date: 02/20/2022   ID: Andre, Swander 1945/11/26, MRN [id number]  PCP:  Glenda Chroman, MD  Cardiologist:  Rozann Lesches, MD Electrophysiologist:  None   Chief Complaint  Patient presents with   Cardiac follow-up    History of Present Illness: Zachary Craig is a 77 y.o. male seen in consultation back in August 2021.  He is here for a routine follow-up visit.  Reports no obvious angina, stable NYHA class I-II dyspnea with typical activities.  He is still working full-time at [employer], also stays busy and his time off.  No definite change in stamina.  No dizziness or syncope.  He continues to follow with Dr. Woody Seller for primary care and will have lab work in March of this year.  I personally reviewed his ECG today which shows normal sinus rhythm.  He is on losartan and at this point Crestor 5 mg twice weekly.  His last LDL was 80 in March of last year.  I talked with him about rationale for more aggressive LDL control in the setting of atherosclerosis by CT, even in the absence of symptoms.  Ischemic testing from 2021 was low risk.  He has had some degree of statin myalgias, although tolerating the current regimen.  Past Medical History:  Diagnosis Date   BPH (benign prostatic hyperplasia)    Coronary artery calcification seen on CT scan    Diverticulosis    Essential hypertension    GERD (gastroesophageal reflux disease)    History of URI (upper respiratory infection)    Mixed hyperlipidemia    Type 2 diabetes mellitus (HCC)     Current Outpatient Medications  Medication Sig Dispense Refill   losartan (COZAAR) 25 MG tablet Take 25 mg by mouth daily.     metFORMIN (GLUCOPHAGE-XR) 500 MG 24 hr tablet Take 500 mg by mouth daily before breakfast.     Multiple Vitamin (MULTIVITAMIN WITH MINERALS) TABS tablet Take 1 tablet by mouth daily.     omeprazole (PRILOSEC) 40 MG capsule Take 40 mg by mouth daily as needed (acid reflux/heartburn).      Probiotic Product (DIGESTIVE ADV+BOWEL SUPPORT PO) Take 2 capsules by mouth daily.      rosuvastatin (CRESTOR) 5 MG tablet Take 5 mg by mouth every Sunday.      tamsulosin (FLOMAX) 0.4 MG CAPS capsule Take 0.4 mg by mouth daily.     No current facility-administered medications for this visit.   Allergies:  Sulfa antibiotics   ROS:  No palpitations or syncope.  Physical Exam: VS:  BP 130/80   Pulse 65   Ht 5\' 6"  (1.676 m)   Wt 192 lb 9.6 oz (87.4 kg)   BMI 31.09 kg/m , BMI Body mass index is 31.09 kg/m.  Wt Readings from Last 3 Encounters:  02/20/22 192 lb 9.6 oz (87.4 kg)  04/18/20 190 lb (86.2 kg)  04/02/20 190 lb (86.2 kg)    General: Patient appears comfortable at rest. HEENT: Conjunctiva and lids normal. Neck: Supple, no elevated JVP or carotid bruits. Lungs: Clear to auscultation, nonlabored breathing at rest. Cardiac: Regular rate and rhythm, no S3 or significant systolic murmur. Abdomen: Soft, nontender, bowel sounds present. Extremities: No pitting edema, distal pulses 2+.  ECG:  An ECG dated 08/14/2019 was personally reviewed today and demonstrated:  Sinus rhythm.  Recent Labwork:  March 2023: Hemoglobin 13.9, platelets 269, BUN 13, creatinine 1.13, potassium 4.6, AST 12, ALT 9, cholesterol 135, triglycerides 73,  HDL 40, LDL 80, TSH 2.57  Other Studies Reviewed Today:  Lexiscan Myoview 09/14/2019: No diagnostic ST segment changes to indicate ischemia. Medium sized, mild to moderate intensity, minimally reversible inferior/inferoseptal defect from apex to base. Variable diaphragmatic attenuation suspected, although cannot exclude a mild degree of ischemia. This is a low risk study. Nuclear stress EF: 56%.  Assessment and Plan:  1.  Coronary artery calcification by prior CT imaging, asymptomatic and with overall low risk Myoview in 2021.  He reports no definite change in status, ECG is normal today.  Would continue with risk factor modification at this point.  He is  on losartan and also low-dose Crestor.  LDL was 80 in March of last year.  He will have repeat lab work with PCP this March.  Ideally LDL in the 50-60 range would be more optimal, it may be that he could tolerate a gradual increase in Crestor depending on his follow-up lab work this year.  We will plan to see him back annually unless symptoms intervene.  2.  Essential hypertension, systolic in the 390Z today on losartan.  No changes were made.  Medication Adjustments/Labs and Tests Ordered: Current medicines are reviewed at length with the patient today.  Concerns regarding medicines are outlined above.   Tests Ordered: Orders Placed This Encounter  Procedures   EKG 12-Lead    Medication Changes: No orders of the defined types were placed in this encounter.   Disposition:  Follow up  1 year.  Signed, Satira Sark, MD, [facility] 02/20/2022 10:39 AM    [place] [facility] [place], [place], [place] [address] Phone: [phone]; Fax: [phone]

## 2022-02-20 NOTE — Patient Instructions (Addendum)
Medication Instructions:  Your physician recommends that you continue on your current medications as directed. Please refer to the Current Medication list given to you today.  Labwork: none  Testing/Procedures: none  Follow-Up: Your physician recommends that you schedule a follow-up appointment in: 1 year. You will receive a reminder call in the mail in about 10 months reminding you to call and schedule your appointment. If you don't receive this call, please contact our office.  Any Other Special Instructions Will Be Listed Below (If Applicable).  If you need a refill on your cardiac medications before your next appointment, please call your pharmacy. 

## 2022-03-24 IMAGING — RF DG SWALLOWING FUNCTION
12 of 19 series · 13 of 24 positions shown · non-contrast
Comparison: None

CLINICAL DATA: Dysphagia. Oro pharyngeal phase dysphagia

EXAM:
MODIFIED BARIUM SWALLOW
TECHNIQUE: Different consistencies of barium were administered orally to the
patient by the Speech Pathologist. Imaging of the pharynx was
performed in the lateral projection. The radiologist was present in
the fluoroscopy room for this study, providing personal supervision.
FLUOROSCOPY TIME:  Fluoroscopy Time:  2 minutes 0 seconds
Radiation Exposure Index (if provided by the fluoroscopic device):
40.1 mGy
Number of Acquired Spot Images: multiple fluoroscopic screen
captures

[Series 1: before po · 1 of 38 frames shown]
[frame 6/38]
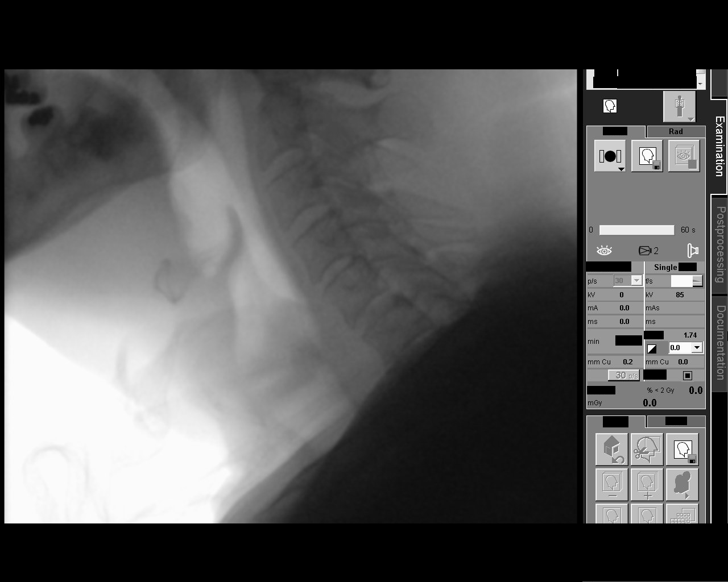

[Series 2: tsp thin · 1 of 81 frames shown (1 of 2)]
[frame 69/81]
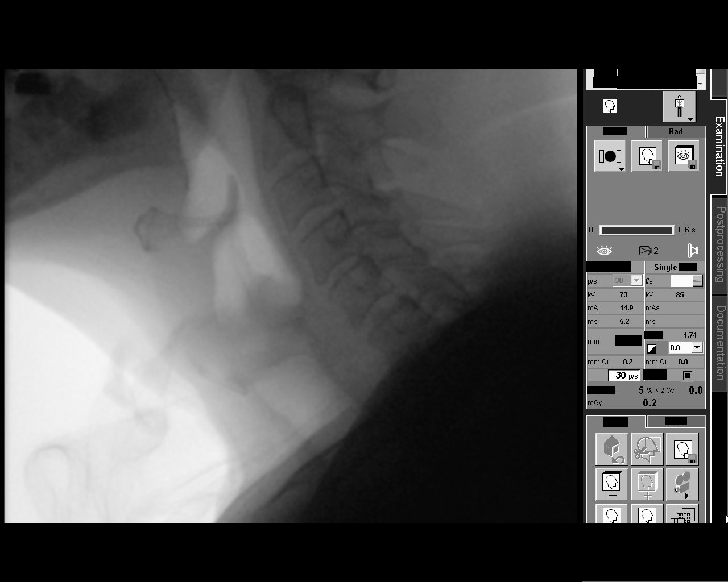

[Series 4: tsp thin · 1 of 75 frames shown (2 of 2)]
[frame 32/75]
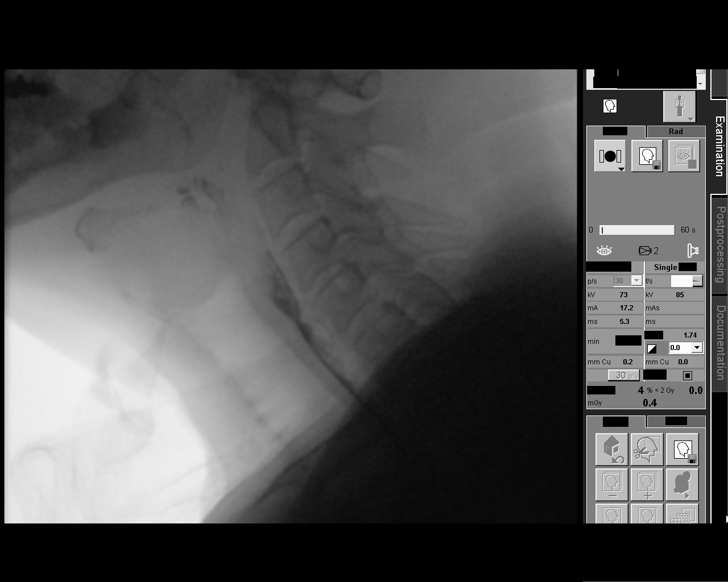

[Series 5: cup sip thin · 1 of 152 frames shown (1 of 2)]
[frame 130/152]
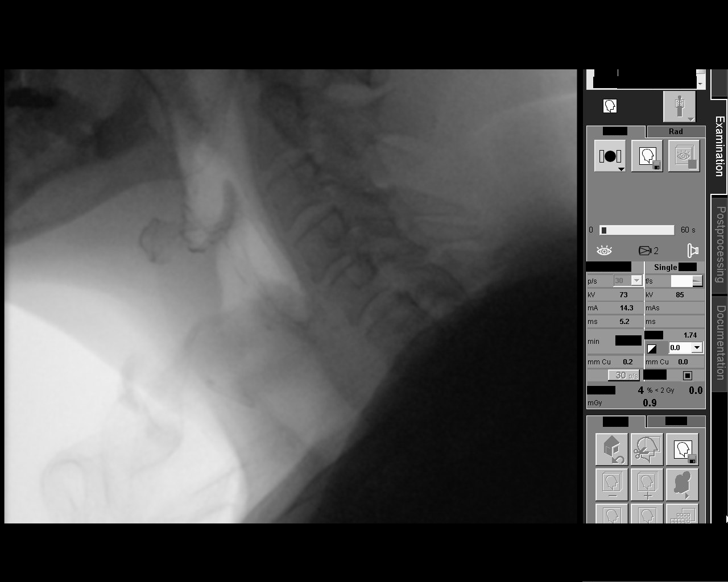

[Series 7: cup sip thin · 1 of 80 frames shown (2 of 2)]
[frame 69/80]
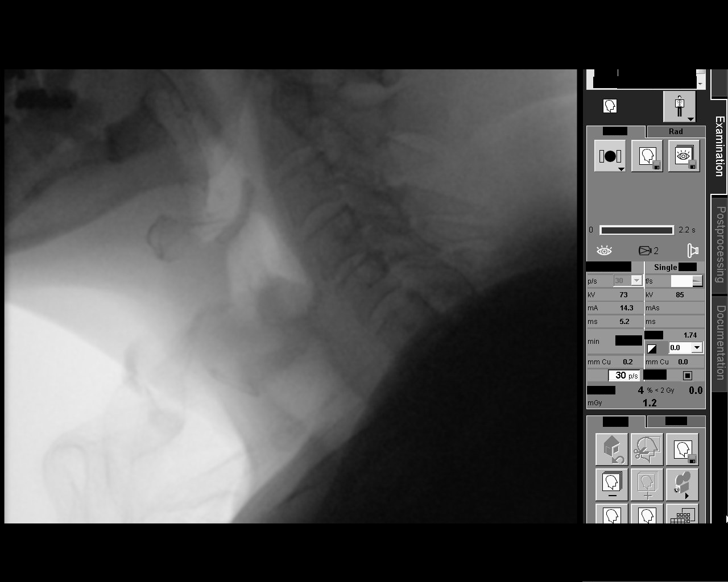

[Series 9: puree · 1 of 118 frames shown (1 of 2)]
[frame 60/118]
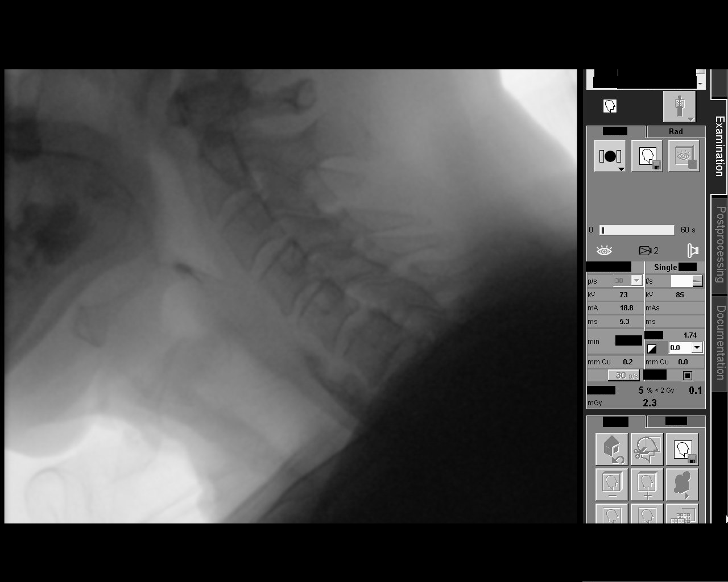

[Series 11: puree · 2 of 178 frames shown (2 of 2)]
[frame 19/178]
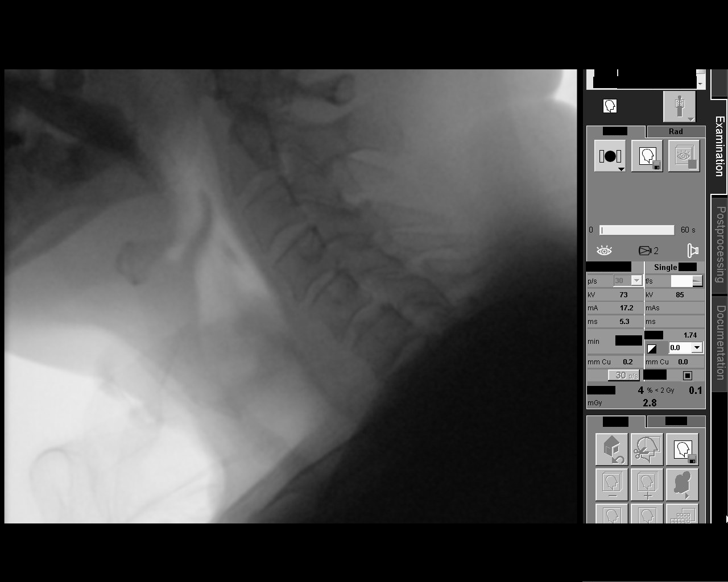
[frame 152/178]
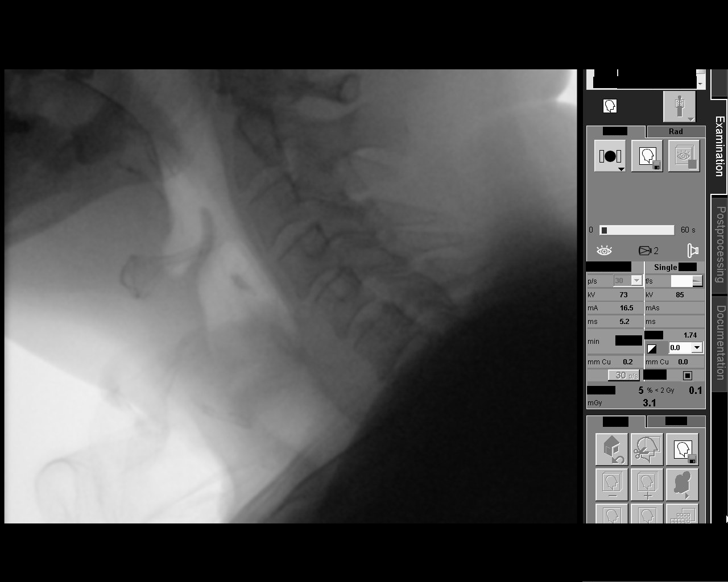

[Series 13: regular · 1 of 367 frames shown]
[frame 143/367]
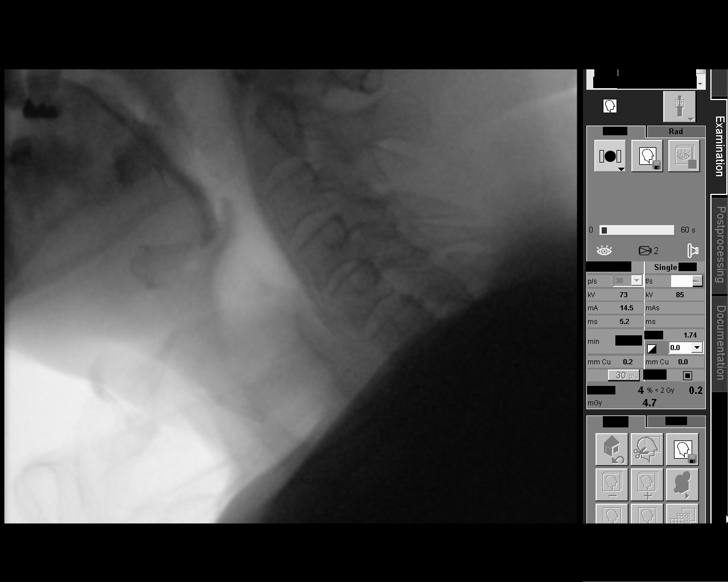

[Series 15: thin with tablet · 1 of 315 frames shown]
[frame 48/315]
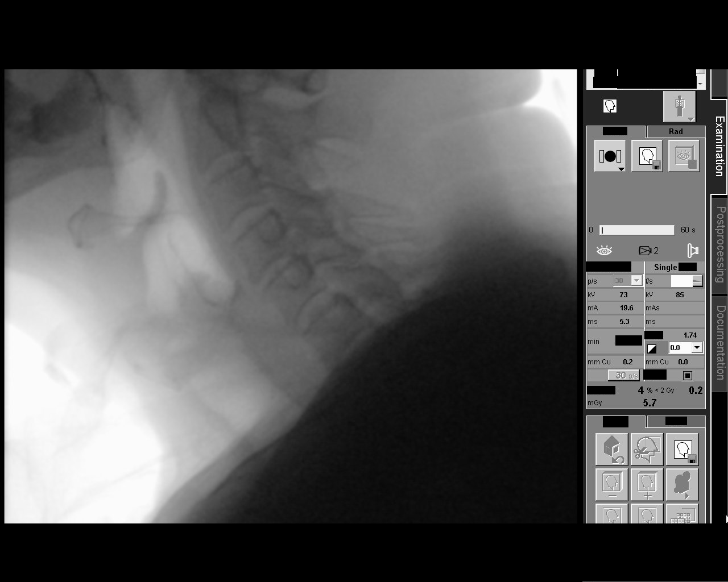

[Series 16: sweep · 1 of 338 frames shown]
[frame 206/338]
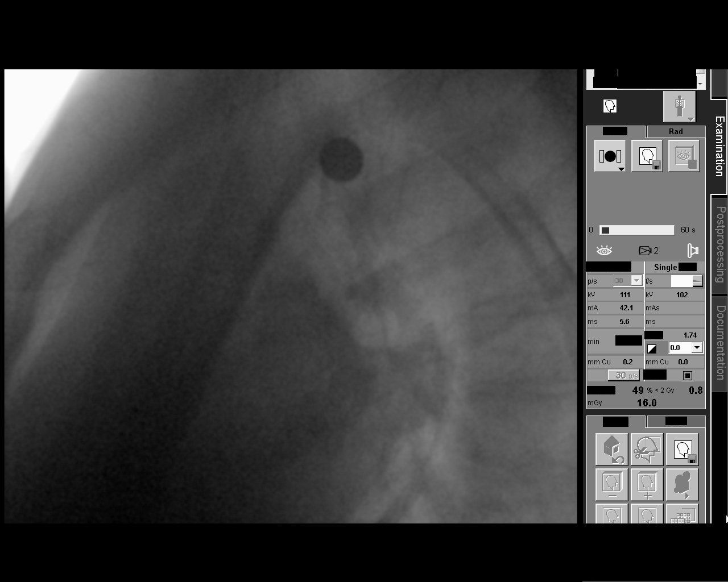

[Series 18: run · 1 of 92 frames shown (1 of 2)]
[frame 26/92]
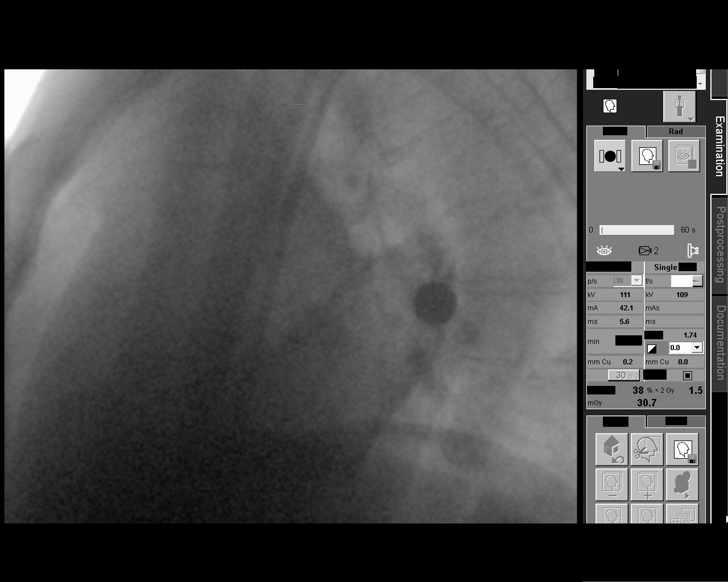

[Series 19: run · 1 of 322 frames shown (2 of 2)]
[frame 274/322]
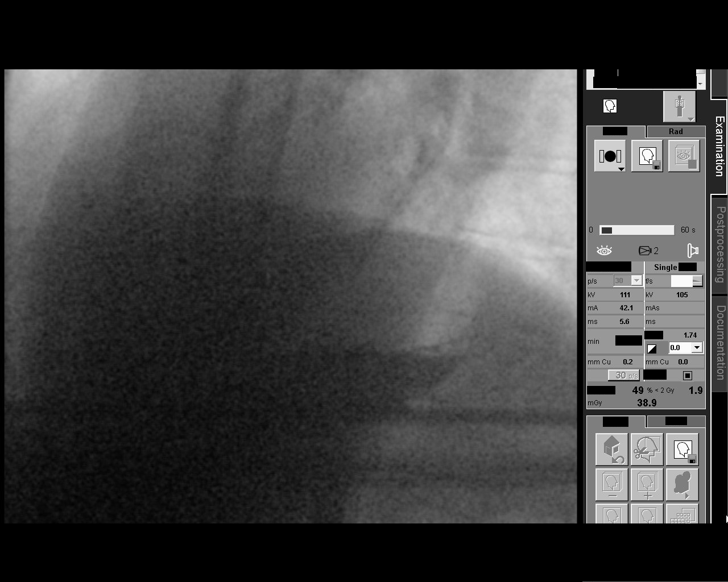

[13 of 24 positions shown; findings below may reference images not displayed]

FINDINGS: Patient swallowed thin barium by teaspoon, cup and straw. Normal
motion seen without laryngeal penetration, aspiration or residuals.

Patient swallowed applesauce consistency and cracker consistency.
Normal motion without laryngeal penetration, aspiration, or
residuals.

Patient swallowed a 12.5 mm diameter barium tablet without
difficulty; tablet passed to stomach without obstruction.
IMPRESSION: Normal exam

Please refer to the Speech Pathologists report for complete details
and recommendations.

## 2022-04-14 ENCOUNTER — Other Ambulatory Visit (INDEPENDENT_AMBULATORY_CARE_PROVIDER_SITE_OTHER): Payer: Medicare HMO

## 2022-04-14 ENCOUNTER — Ambulatory Visit: Payer: Medicare HMO | Admitting: Orthopedic Surgery

## 2022-04-14 ENCOUNTER — Encounter: Payer: Self-pay | Admitting: Orthopedic Surgery

## 2022-04-14 VITALS — BP 144/84 | HR 65 | Ht 66.0 in | Wt 190.0 lb

## 2022-04-14 DIAGNOSIS — M25551 Pain in right hip: Secondary | ICD-10-CM

## 2022-04-14 DIAGNOSIS — M545 Low back pain, unspecified: Secondary | ICD-10-CM

## 2022-04-14 DIAGNOSIS — M1611 Unilateral primary osteoarthritis, right hip: Secondary | ICD-10-CM

## 2022-04-14 DIAGNOSIS — M48061 Spinal stenosis, lumbar region without neurogenic claudication: Secondary | ICD-10-CM

## 2022-04-14 MED ORDER — MELOXICAM 7.5 MG PO TABS: 7.5000 mg | ORAL_TABLET | Freq: Every day | ORAL | 5 refills | Status: DC

## 2022-04-14 MED ORDER — METHOCARBAMOL 500 MG PO TABS: 500.0000 mg | ORAL_TABLET | Freq: Three times a day (TID) | ORAL | 1 refills | Status: DC

## 2022-04-14 NOTE — Progress Notes (Signed)
Chief Complaint  Patient presents with   Back Pain    Right buttock down back of right thigh    Hip Problem    Patient was told he will need right hip replacement in 1999 when he had his left hip replaced     HPI: Mr. 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  He specifically denied groin or anterior thigh pain or difficulties with his ADLs in terms of hip joint pathology.  He does indicate at times it feels like the hip is "slipping in and out of place and it is caused him to have difficulty weightbearing  He does have diabetes he is not a smoker he denies any injury.    Past Medical History:  Diagnosis Date   BPH (benign prostatic hyperplasia)    Coronary artery calcification seen on CT scan    Diverticulosis    Essential hypertension    GERD (gastroesophageal reflux disease)    History of URI (upper respiratory infection)    Mixed hyperlipidemia    Type 2 diabetes mellitus     BP (!) 144/84   Pulse 65   Ht 5\' 6"  (1.676 m)   Wt 190 lb (86.2 kg)   BMI 30.67 kg/m    General appearance: Well-developed well-nourished no gross deformities  Cardiovascular normal pulse and perfusion normal color without edema  Neurologically no sensation loss or deficits or pathologic reflexes  Psychological: Awake alert and oriented x3 mood and affect normal  Skin no lacerations or ulcerations no nodularity no palpable masses, no erythema or nodularity  Musculoskeletal: Right hip exam shows  full flexion extension of the hip he had some discomfort in adduction but this was all related to the lateral side of the hip with no groin pain.  He had decreased internal rotation of the right hip but without increase in pain  He does have a slight leg length discrepancy right shorter than left  He had no pain on flexion extension of his spine and no tenderness in his back  Imaging in-house imaging included  1.  Lumbar spine imaging shows a scoliotic list of the left and degenerative disc disease L4-S1 without any spondylolysis or listhesis  2.  Hip pelvis x-ray shows a stable left total hip close pin design press-fit on the stem and screw fixation on the cup  On the right side we see centralized arthritis of the hip with clear joint space in the weightbearing area but narrowing centrally and some abnormal shape to the femoral head.  A/P  Differential diagnosis degenerative disc disease causing lateral hip pain in the L5 dermatome or L4 dermatome versus hip arthritis  Based on imaging and history and physical exam this seems to be coming from his back probably L5 root  Recommend that we start him on an anti-inflammatory and a muscle relaxer see him in 3 weeks if he is not better I will try an  intra-articular injection as a diagnostic test  Encounter Diagnoses  Name Primary?   Lumbar pain    Pain in right hip    Degenerative lumbar spinal stenosis Yes   Arthritis of right hip      Meds ordered this encounter  Medications   meloxicam (MOBIC) 7.5 MG tablet    Sig: Take 1 tablet (7.5 mg total) by mouth daily.    Dispense:  30 tablet    Refill:  5   methocarbamol (ROBAXIN) 500 MG tablet    Sig: Take 1 tablet (500 mg total) by mouth 3 (three) times daily.    Dispense:  60 tablet    Refill:  1

## 2022-05-05 ENCOUNTER — Ambulatory Visit: Payer: Medicare HMO | Admitting: Orthopedic Surgery

## 2022-05-05 DIAGNOSIS — M545 Low back pain, unspecified: Secondary | ICD-10-CM

## 2022-05-05 DIAGNOSIS — M1611 Unilateral primary osteoarthritis, right hip: Secondary | ICD-10-CM | POA: Diagnosis not present

## 2022-05-05 DIAGNOSIS — M48061 Spinal stenosis, lumbar region without neurogenic claudication: Secondary | ICD-10-CM

## 2022-05-05 MED ORDER — MELOXICAM 7.5 MG PO TABS: 7.5000 mg | ORAL_TABLET | Freq: Every day | ORAL | 5 refills | Status: AC

## 2022-05-05 MED ORDER — METHOCARBAMOL 500 MG PO TABS: 500.0000 mg | ORAL_TABLET | Freq: Three times a day (TID) | ORAL | 1 refills | Status: AC

## 2022-05-05 NOTE — Progress Notes (Signed)
   Chief Complaint  Patient presents with   Back Pain    Better than I was before would rate pain 3/4 out of 10 now.    HPI: 77 year old male with right hip pain lower back pain seen about 3 weeks ago has improved with Robaxin and meloxicam.  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    Dispense:  30 tablet    Refill:  5   methocarbamol (ROBAXIN) 500 MG tablet    Sig: Take 1 tablet (500 mg total) by mouth 3 (three) times daily.    Dispense:  60 tablet    Refill:  1     Encounter Diagnoses  Name Primary?   Lumbar pain Yes   Degenerative lumbar spinal stenosis

## 2022-05-05 NOTE — Patient Instructions (Addendum)
Continue taking your current medications.   [facility] [place] Address: [address] [place], [place] [address] Phone: [phone]

## 2022-06-12 ENCOUNTER — Ambulatory Visit: Payer: Medicare HMO | Admitting: Orthopedic Surgery

## 2022-06-23 ENCOUNTER — Encounter: Payer: Self-pay | Admitting: Orthopedic Surgery

## 2022-06-23 ENCOUNTER — Ambulatory Visit: Payer: Medicare HMO | Admitting: Orthopedic Surgery

## 2022-06-23 VITALS — BP 143/79 | HR 77 | Ht 66.0 in | Wt 191.0 lb

## 2022-06-23 DIAGNOSIS — M48061 Spinal stenosis, lumbar region without neurogenic claudication: Secondary | ICD-10-CM

## 2022-06-23 DIAGNOSIS — M1611 Unilateral primary osteoarthritis, right hip: Secondary | ICD-10-CM

## 2022-06-23 NOTE — Progress Notes (Signed)
Chief Complaint  Patient presents with   Hip Pain    Fu right hip still have some pain but a lot better   Encounter Diagnoses  Name Primary?   Degenerative lumbar spinal stenosis Yes   Arthritis of right hip     77 year old male with back and hip pain from arthritis status post left total hip currently managed with meloxicam  Currently having very minimal discomfort in the groin the pain on the right lateral side of his leg which we correctly diagnosed as radicular pain is better.  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

## 2023-03-06 ENCOUNTER — Ambulatory Visit: Payer: Medicare HMO | Admitting: Cardiology

## 2023-03-24 ENCOUNTER — Encounter: Payer: Self-pay | Admitting: Cardiology

## 2023-06-22 ENCOUNTER — Ambulatory Visit: Payer: Medicare HMO | Admitting: Orthopedic Surgery

## 2023-08-12 ENCOUNTER — Ambulatory Visit: Attending: Cardiology | Admitting: Cardiology

## 2023-08-12 ENCOUNTER — Encounter: Payer: Self-pay | Admitting: Cardiology

## 2023-08-12 VITALS — BP 110/52 | HR 75 | Ht 66.0 in | Wt 181.0 lb

## 2023-08-12 DIAGNOSIS — I471 Supraventricular tachycardia, unspecified: Secondary | ICD-10-CM | POA: Diagnosis not present

## 2023-08-12 DIAGNOSIS — I10 Essential (primary) hypertension: Secondary | ICD-10-CM

## 2023-08-12 DIAGNOSIS — I2510 Atherosclerotic heart disease of native coronary artery without angina pectoris: Secondary | ICD-10-CM | POA: Diagnosis not present

## 2023-08-12 NOTE — Patient Instructions (Signed)
 Medication Instructions:  Your physician recommends that you continue on your current medications as directed. Please refer to the Current Medication list given to you today.   Labwork: None today  Testing/Procedures: None today  Follow-Up: 6 months  Any Other Special Instructions Will Be Listed Below (If Applicable).  If you need a refill on your cardiac medications before your next appointment, please call your pharmacy.

## 2023-08-12 NOTE — Progress Notes (Signed)
    Cardiology Office Note  Date: 08/12/2023   ID: Zachary Craig, Zachary Craig 09-01-1945, MRN [id number]  History of Present Illness: Zachary Craig is a 78 y.o. male last seen in February 2024.  He is here for a follow-up visit.  I do not have complete information, but he tells me that his insurance company arranged for him to wear a ZIO monitor, indication unknown as best I can tell.  It looks like this was performed back in February and resulted to his PCP Dr. Rosamond.  I do not have the full report, but review of strips shows episodes of PSVT (not atrial fibrillation).  He does not describe any sense of palpitations, no sudden shortness of breath, no dizziness or syncope.  He reports good stamina with typical activities.  He was started on Toprol-XL 25 mg daily by Dr. Rosamond.  We went over his medications.  He also remains on Crestor 5 mg once a week and Cozaar 25 mg daily.  Blood pressure today is normal.  His LDL was 96 recently.  I reviewed his ECG today which shows normal sinus rhythm.  Physical Exam: VS:  BP (!) 110/52 (BP Location: Left Arm, Patient Position: Sitting, Cuff Size: Normal)   Pulse 75   Ht 5' 6 (1.676 m)   Wt 181 lb (82.1 kg)   SpO2 97%   BMI 29.21 kg/m , BMI Body mass index is 29.21 kg/m.  Wt Readings from Last 3 Encounters:  08/12/23 181 lb (82.1 kg)  06/23/22 191 lb (86.6 kg)  04/14/22 190 lb (86.2 kg)    General: Patient appears comfortable at rest. HEENT: Conjunctiva and lids normal. Neck: Supple, no elevated JVP or carotid bruits. Lungs: Clear to auscultation, nonlabored breathing at rest. Cardiac: Regular rate and rhythm, no S3 or significant systolic murmur, no pericardial rub. Extremities: No pitting edema.  ECG:  An ECG dated 02/20/2022 was personally reviewed today and demonstrated:  Sinus rhythm.  Labwork:  July 2025: Hemoglobin 13.9, platelets 255, BUN 15, creatinine 1.15, GFR 66, potassium 4.7, AST 18, ALT 7, cholesterol 155, triglycerides 83, HDL 43,  LDL 96, TSH 3.03  Other Studies Reviewed Today:  No interval cardiac testing for review today.  Assessment and Plan:  1.  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  He does not report any sense of palpitations, no sudden shortness of breath, no dizziness or syncope.  No change in stamina or functional capacity.  I am requesting the full report from his PCP Dr. Rosamond.  He has already been started on Toprol-XL 25 mg daily and his ECG is normal today.  He did not have any atrial fibrillation based on the strips I was able to review.  Would plan to observe at this point.  2.  Coronary artery calcification by CT imaging, asymptomatic and with overall low risk Myoview  in 2021.  He does not report any exertional chest pain.  Currently taking Crestor 5 mg once weekly.   3.  Primary hypertension.  Blood pressure is normal today.  He continues on Cozaar 25 mg daily as well.  Disposition:  Follow up 6 months.  Signed, Jayson JUDITHANN Sierras, M.D., F.A.C.C. [facility]
